# Patient Record
Sex: Male | Born: 1960 | Race: White | Hispanic: No | Marital: Married | Smoking: Current every day smoker
Health system: Southern US, Community
[De-identification: ages and names within clinical notes are randomized; demographics above are authoritative.]

## PROBLEM LIST (undated history)

## (undated) DIAGNOSIS — F4312 Post-traumatic stress disorder, chronic: Secondary | ICD-10-CM

## (undated) DIAGNOSIS — C259 Malignant neoplasm of pancreas, unspecified: Secondary | ICD-10-CM

## (undated) DIAGNOSIS — S069XAA Unspecified intracranial injury with loss of consciousness status unknown, initial encounter: Secondary | ICD-10-CM

## (undated) DIAGNOSIS — R569 Unspecified convulsions: Secondary | ICD-10-CM

## (undated) DIAGNOSIS — G8929 Other chronic pain: Secondary | ICD-10-CM

## (undated) DIAGNOSIS — S069X9A Unspecified intracranial injury with loss of consciousness of unspecified duration, initial encounter: Secondary | ICD-10-CM

## (undated) DIAGNOSIS — S42009A Fracture of unspecified part of unspecified clavicle, initial encounter for closed fracture: Secondary | ICD-10-CM

## (undated) HISTORY — PX: BACK SURGERY: SHX140

---

## 2017-10-02 DIAGNOSIS — R69 Illness, unspecified: Secondary | ICD-10-CM | POA: Diagnosis not present

## 2017-10-30 DIAGNOSIS — R69 Illness, unspecified: Secondary | ICD-10-CM | POA: Diagnosis not present

## 2017-11-27 DIAGNOSIS — R69 Illness, unspecified: Secondary | ICD-10-CM | POA: Diagnosis not present

## 2017-12-17 DIAGNOSIS — F4310 Post-traumatic stress disorder, unspecified: Secondary | ICD-10-CM | POA: Diagnosis not present

## 2017-12-17 DIAGNOSIS — Z9114 Patient's other noncompliance with medication regimen: Secondary | ICD-10-CM | POA: Diagnosis not present

## 2017-12-17 DIAGNOSIS — F17210 Nicotine dependence, cigarettes, uncomplicated: Secondary | ICD-10-CM | POA: Diagnosis not present

## 2017-12-17 DIAGNOSIS — F333 Major depressive disorder, recurrent, severe with psychotic symptoms: Secondary | ICD-10-CM | POA: Diagnosis not present

## 2017-12-17 DIAGNOSIS — R45851 Suicidal ideations: Secondary | ICD-10-CM | POA: Diagnosis not present

## 2017-12-17 DIAGNOSIS — Z8507 Personal history of malignant neoplasm of pancreas: Secondary | ICD-10-CM | POA: Diagnosis not present

## 2017-12-17 DIAGNOSIS — R45850 Homicidal ideations: Secondary | ICD-10-CM | POA: Diagnosis not present

## 2017-12-17 DIAGNOSIS — F329 Major depressive disorder, single episode, unspecified: Secondary | ICD-10-CM | POA: Diagnosis not present

## 2017-12-17 DIAGNOSIS — Z915 Personal history of self-harm: Secondary | ICD-10-CM | POA: Diagnosis not present

## 2017-12-17 DIAGNOSIS — Z720 Tobacco use: Secondary | ICD-10-CM | POA: Diagnosis not present

## 2017-12-17 DIAGNOSIS — E039 Hypothyroidism, unspecified: Secondary | ICD-10-CM | POA: Diagnosis not present

## 2017-12-17 DIAGNOSIS — F419 Anxiety disorder, unspecified: Secondary | ICD-10-CM | POA: Diagnosis not present

## 2017-12-17 DIAGNOSIS — F1410 Cocaine abuse, uncomplicated: Secondary | ICD-10-CM | POA: Diagnosis not present

## 2017-12-17 DIAGNOSIS — Z87820 Personal history of traumatic brain injury: Secondary | ICD-10-CM | POA: Diagnosis not present

## 2017-12-17 DIAGNOSIS — R569 Unspecified convulsions: Secondary | ICD-10-CM | POA: Diagnosis not present

## 2017-12-17 DIAGNOSIS — Z886 Allergy status to analgesic agent status: Secondary | ICD-10-CM | POA: Diagnosis not present

## 2017-12-17 DIAGNOSIS — G40909 Epilepsy, unspecified, not intractable, without status epilepticus: Secondary | ICD-10-CM | POA: Diagnosis not present

## 2017-12-17 DIAGNOSIS — R69 Illness, unspecified: Secondary | ICD-10-CM | POA: Diagnosis not present

## 2017-12-18 DIAGNOSIS — R45851 Suicidal ideations: Secondary | ICD-10-CM | POA: Diagnosis not present

## 2017-12-18 DIAGNOSIS — Z720 Tobacco use: Secondary | ICD-10-CM | POA: Diagnosis not present

## 2017-12-18 DIAGNOSIS — G40909 Epilepsy, unspecified, not intractable, without status epilepticus: Secondary | ICD-10-CM | POA: Diagnosis not present

## 2017-12-18 DIAGNOSIS — E039 Hypothyroidism, unspecified: Secondary | ICD-10-CM | POA: Diagnosis not present

## 2017-12-19 DIAGNOSIS — R45851 Suicidal ideations: Secondary | ICD-10-CM | POA: Diagnosis not present

## 2017-12-19 DIAGNOSIS — G40909 Epilepsy, unspecified, not intractable, without status epilepticus: Secondary | ICD-10-CM | POA: Diagnosis not present

## 2017-12-19 DIAGNOSIS — E039 Hypothyroidism, unspecified: Secondary | ICD-10-CM | POA: Diagnosis not present

## 2017-12-19 DIAGNOSIS — Z720 Tobacco use: Secondary | ICD-10-CM | POA: Diagnosis not present

## 2017-12-20 DIAGNOSIS — R69 Illness, unspecified: Secondary | ICD-10-CM | POA: Diagnosis not present

## 2017-12-20 DIAGNOSIS — F4310 Post-traumatic stress disorder, unspecified: Secondary | ICD-10-CM | POA: Diagnosis not present

## 2017-12-21 DIAGNOSIS — R69 Illness, unspecified: Secondary | ICD-10-CM | POA: Diagnosis not present

## 2017-12-21 DIAGNOSIS — F4310 Post-traumatic stress disorder, unspecified: Secondary | ICD-10-CM | POA: Diagnosis not present

## 2017-12-22 DIAGNOSIS — F333 Major depressive disorder, recurrent, severe with psychotic symptoms: Secondary | ICD-10-CM | POA: Diagnosis not present

## 2017-12-22 DIAGNOSIS — R69 Illness, unspecified: Secondary | ICD-10-CM | POA: Diagnosis not present

## 2017-12-23 DIAGNOSIS — R69 Illness, unspecified: Secondary | ICD-10-CM | POA: Diagnosis not present

## 2017-12-23 DIAGNOSIS — F333 Major depressive disorder, recurrent, severe with psychotic symptoms: Secondary | ICD-10-CM | POA: Diagnosis not present

## 2017-12-24 DIAGNOSIS — F4310 Post-traumatic stress disorder, unspecified: Secondary | ICD-10-CM | POA: Diagnosis not present

## 2017-12-24 DIAGNOSIS — Z720 Tobacco use: Secondary | ICD-10-CM | POA: Diagnosis not present

## 2017-12-24 DIAGNOSIS — E039 Hypothyroidism, unspecified: Secondary | ICD-10-CM | POA: Diagnosis not present

## 2017-12-24 DIAGNOSIS — R69 Illness, unspecified: Secondary | ICD-10-CM | POA: Diagnosis not present

## 2017-12-25 DIAGNOSIS — E039 Hypothyroidism, unspecified: Secondary | ICD-10-CM | POA: Diagnosis not present

## 2017-12-25 DIAGNOSIS — F4310 Post-traumatic stress disorder, unspecified: Secondary | ICD-10-CM | POA: Diagnosis not present

## 2017-12-25 DIAGNOSIS — Z720 Tobacco use: Secondary | ICD-10-CM | POA: Diagnosis not present

## 2017-12-25 DIAGNOSIS — R69 Illness, unspecified: Secondary | ICD-10-CM | POA: Diagnosis not present

## 2017-12-30 DIAGNOSIS — R69 Illness, unspecified: Secondary | ICD-10-CM | POA: Diagnosis not present

## 2018-01-13 DIAGNOSIS — R69 Illness, unspecified: Secondary | ICD-10-CM | POA: Diagnosis not present

## 2018-03-03 DIAGNOSIS — R69 Illness, unspecified: Secondary | ICD-10-CM | POA: Diagnosis not present

## 2018-03-27 DIAGNOSIS — K862 Cyst of pancreas: Secondary | ICD-10-CM | POA: Diagnosis not present

## 2018-04-29 DIAGNOSIS — R69 Illness, unspecified: Secondary | ICD-10-CM | POA: Diagnosis not present

## 2018-06-29 ENCOUNTER — Other Ambulatory Visit: Payer: Self-pay

## 2018-06-29 ENCOUNTER — Ambulatory Visit (INDEPENDENT_AMBULATORY_CARE_PROVIDER_SITE_OTHER): Payer: Medicare HMO

## 2018-06-29 ENCOUNTER — Ambulatory Visit
Admission: EM | Admit: 2018-06-29 | Discharge: 2018-06-29 | Disposition: A | Discharge status: Home / Self Care | Payer: Medicare HMO | Attending: Family Medicine | Admitting: Family Medicine

## 2018-06-29 ENCOUNTER — Encounter: Payer: Self-pay | Admitting: Emergency Medicine

## 2018-06-29 DIAGNOSIS — C259 Malignant neoplasm of pancreas, unspecified: Secondary | ICD-10-CM | POA: Insufficient documentation

## 2018-06-29 DIAGNOSIS — Z79899 Other long term (current) drug therapy: Secondary | ICD-10-CM | POA: Diagnosis not present

## 2018-06-29 DIAGNOSIS — Z888 Allergy status to other drugs, medicaments and biological substances status: Secondary | ICD-10-CM | POA: Diagnosis not present

## 2018-06-29 DIAGNOSIS — S42002A Fracture of unspecified part of left clavicle, initial encounter for closed fracture: Secondary | ICD-10-CM | POA: Diagnosis not present

## 2018-06-29 DIAGNOSIS — M25512 Pain in left shoulder: Secondary | ICD-10-CM | POA: Diagnosis not present

## 2018-06-29 DIAGNOSIS — F17200 Nicotine dependence, unspecified, uncomplicated: Secondary | ICD-10-CM | POA: Insufficient documentation

## 2018-06-29 DIAGNOSIS — F4312 Post-traumatic stress disorder, chronic: Secondary | ICD-10-CM | POA: Insufficient documentation

## 2018-06-29 DIAGNOSIS — R69 Illness, unspecified: Secondary | ICD-10-CM | POA: Diagnosis not present

## 2018-06-29 DIAGNOSIS — Z880 Allergy status to penicillin: Secondary | ICD-10-CM | POA: Insufficient documentation

## 2018-06-29 DIAGNOSIS — R0602 Shortness of breath: Secondary | ICD-10-CM

## 2018-06-29 DIAGNOSIS — M542 Cervicalgia: Secondary | ICD-10-CM | POA: Diagnosis not present

## 2018-06-29 DIAGNOSIS — R569 Unspecified convulsions: Secondary | ICD-10-CM | POA: Insufficient documentation

## 2018-06-29 DIAGNOSIS — M898X1 Other specified disorders of bone, shoulder: Secondary | ICD-10-CM | POA: Diagnosis not present

## 2018-06-29 DIAGNOSIS — Z87820 Personal history of traumatic brain injury: Secondary | ICD-10-CM | POA: Diagnosis not present

## 2018-06-29 DIAGNOSIS — G8929 Other chronic pain: Secondary | ICD-10-CM | POA: Insufficient documentation

## 2018-06-29 DIAGNOSIS — J449 Chronic obstructive pulmonary disease, unspecified: Secondary | ICD-10-CM | POA: Diagnosis not present

## 2018-06-29 DIAGNOSIS — S42022A Displaced fracture of shaft of left clavicle, initial encounter for closed fracture: Secondary | ICD-10-CM | POA: Diagnosis not present

## 2018-06-29 HISTORY — DX: Unspecified convulsions: R56.9

## 2018-06-29 HISTORY — DX: Unspecified intracranial injury with loss of consciousness status unknown, initial encounter: S06.9XAA

## 2018-06-29 HISTORY — DX: Fracture of unspecified part of unspecified clavicle, initial encounter for closed fracture: S42.009A

## 2018-06-29 HISTORY — DX: Post-traumatic stress disorder, chronic: F43.12

## 2018-06-29 HISTORY — DX: Unspecified intracranial injury with loss of consciousness of unspecified duration, initial encounter: S06.9X9A

## 2018-06-29 HISTORY — DX: Other chronic pain: G89.29

## 2018-06-29 HISTORY — DX: Malignant neoplasm of pancreas, unspecified: C25.9

## 2018-06-29 MED ORDER — ALBUTEROL SULFATE HFA 108 (90 BASE) MCG/ACT IN AERS: 2.0000 | INHALATION_SPRAY | RESPIRATORY_TRACT | 0 refills | Status: AC | PRN

## 2018-06-29 MED ORDER — PREDNISONE 10 MG PO TABS: ORAL_TABLET | ORAL | 0 refills | Status: AC

## 2018-06-29 NOTE — Discharge Instructions (Signed)
Take medication as prescribed. Rest. Drink plenty of fluids.   Follow-up with orthopedic this week as discussed.  See above to call today to schedule.  Follow up with your primary care physician soon as possible.  Return to Urgent care for new or worsening concerns.

## 2018-06-29 NOTE — ED Triage Notes (Signed)
Patient stated he broke his left side collar bone 2 years ago. Patient stated he has had constant pain for 2 years. Patient just moved here and stated PCP can't see him until December.

## 2018-06-29 NOTE — ED Provider Notes (Addendum)
[facility] ____________________________________________  Time seen: Approximately 11:55 AM  I have reviewed the triage vital signs and the nursing notes.   HISTORY  Chief Complaint Shoulder Pain  HPI Brandon Mcintosh is a 56 y.o. male past medical history of PTSD, left clavicle fracture with chronic pain, TBI, seizures, pancreatic cyst, presenting for evaluation of acute on chronic left clavicular pain.  Patient states this pain is been present for the last 2 years after he sustained a fall while repairing his deck and fell directly on his left shoulder.  States pain flares up every so often, and reports the pain is been present for the last several weeks.  States pain is present with laying on his left side, left shoulder movement as well as direct palpation.  States no pain if sitting completely still and comfortable position.  States occasional pain into his left upper shoulder.  Denies any recent fall or repeat injury.  No skin changes.  No chest pain.  States this feels consistent with his previous injury.  Patient also reports he has chronic left neck pain that is been ongoing for years and intermittently radiates up and down his neck, denies any acute changes to this.  Patient also reports for approximately 1 year he has been having shortness of breath, and reports that his girlfriend intermittently tells him that he is wheezing, worse at night. Denies known trigger for shortness of breath.  States some intermittent shortness of breath today as well, none current.  No recent fevers, nasal congestion, atypical cough, hemoptysis, weight loss or activity changes.  Continues remain active.  States that he has been taking his regular medicines as well as ibuprofen 600 mg and baclofen for his left shoulder without resolution.  Reports allergic to Toradol.  Denies other aggravating or alleviating factors.  Currently awaiting new PCP appointment that is in December.  Follows with psychiatry at  [facility]. Denies chest pain, abdominal pain, dysuria, lower extremity pain, extremity swelling or rash. Denies recent sickness. Denies recent antibiotic use.   Patient was also recently seen in ER for depression and suicidal ideation,seen 05/21/2018 in [place].  Patient denies any suicidal or homicidal ideation at this time.   Past Medical History:  Diagnosis Date  . Chronic pain   . Chronic post-traumatic stress disorder (PTSD)   . Clavicle fracture   . Pancreatic cancer ([place])   . Seizure ([place])   . TBI (traumatic brain injury) ([place])     There are no active problems to display for this patient.   Past Surgical History:  Procedure Laterality Date  . BACK SURGERY       No current facility-administered medications for this encounter.   Current Outpatient Medications:  .  acetaminophen (TYLENOL) 500 MG tablet, Take 500 mg by mouth every 6 (six) hours as needed., Disp: , Rfl:  .  baclofen (LIORESAL) 10 MG tablet, Take 10 mg by mouth 3 (three) times daily., Disp: , Rfl:  .  gabapentin (NEURONTIN) 300 MG capsule, Take 300 mg by mouth 3 (three) times daily., Disp: , Rfl:  .  levETIRAcetam (KEPPRA) 750 MG tablet, Take 750 mg by mouth 2 (two) times daily., Disp: , Rfl:  .  QUEtiapine (SEROQUEL) 100 MG tablet, Take 100 mg by mouth daily with breakfast., Disp: , Rfl:  .  QUEtiapine (SEROQUEL) 400 MG tablet, Take 400 mg by mouth at bedtime., Disp: , Rfl:  .  sucralfate (CARAFATE) 1 g tablet, Take 1 g by mouth 4 (four) times daily.,  Disp: , Rfl:  .  traZODone (DESYREL) 100 MG tablet, Take 100 mg by mouth at bedtime., Disp: , Rfl:  .  albuterol (PROVENTIL HFA;VENTOLIN HFA) 108 (90 Base) MCG/ACT inhaler, Inhale 2 puffs into the lungs every 4 (four) hours as needed., Disp: 1 Inhaler, Rfl: 0 .  predniSONE (DELTASONE) 10 MG tablet, Start 60 mg po day one, then 50 mg po day two, taper by 10 mg daily until complete., Disp: 21 tablet, Rfl: 0  Allergies Penicillins; Propoxyphene; Toradol [ketorolac  tromethamine]; and Tramadol  History reviewed. No pertinent family history.  Social History Social History   Tobacco Use  . Smoking status: Current Every Day Smoker  . Smokeless tobacco: Never Used  Substance Use Topics  . Alcohol use: Yes  . Drug use: Yes    Types: Marijuana    Comment: 2 months ago    Review of Systems Constitutional: No fever Cardiovascular: Denies chest pain. Respiratory: As above.  Gastrointestinal: No abdominal pain.  No nausea, no vomiting.  No diarrhea.  No constipation. Genitourinary: Negative for dysuria. Musculoskeletal: Negative for back pain. Skin: Negative for rash. Neurological: Negative for focal weakness or numbness.   ____________________________________________   PHYSICAL EXAM:  VITAL SIGNS: ED Triage Vitals [06/29/18 1049]  Enc Vitals Group     BP 117/89     Pulse Rate 82     Resp 18     Temp 98.1 F (36.7 C)     Temp Source Oral     SpO2 97 %     Weight 170 lb (77.1 kg)     Height 5\' 11"  (1.803 m)     Head Circumference      Peak Flow      Pain Score 9     Pain Loc      Pain Edu?      Excl. in [place]?     Constitutional: Alert and oriented. Well appearing and in no acute distress. Eyes: Conjunctivae are normal.  ENT      Head: Normocephalic and atraumatic.      Nose: No congestion      Mouth/Throat: Mucous membranes are moist.Oropharynx non-erythematous. Neck: No stridor. Supple without meningismus.  Hematological/Lymphatic/Immunilogical: No cervical lymphadenopathy. Cardiovascular: Normal rate, regular rhythm. Grossly normal heart sounds.  Good peripheral circulation. Respiratory: Normal respiratory effort without tachypnea nor retractions. Breath sounds are clear and equal bilaterally. No wheezes, rales, rhonchi. Musculoskeletal:Steady gait. Left hand grip strong.  Left mid to distal clavicle deformity palpated with moderate tenderness to direct palpation, no ecchymosis, left anterior shoulder at [facility] joint mild to  moderate tenderness to direct palpation, pain with left shoulder abduction, and negative drop arm test, able to abduct greater than 90 degrees, left upper extremity otherwise nontender, normal sensation to left upper extremity per patient, chest nontender to direct palpation.  Neurologic:  Normal speech and language. No gross focal neurologic deficits are appreciated.  Skin:  Skin is warm, dry and intact. No rash noted. Psychiatric: Mood and affect are normal. Speech and behavior are normal. Patient exhibits appropriate insight and judgment   ___________________________________________   LABS (all labs ordered are listed, but only abnormal results are displayed)  Labs Reviewed - No data to display ____________________________________________  EKG  ED ECG REPORT I, Marylene Land, the attending provider, personally viewed and interpreted this ECG.   Date: 06/29/2018  EKG Time: 1202   Rate: 66  Rhythm:  normal sinus rhythm  Axis: normal   Intervals:none  ST&T Change: none noted.  No previous EKG available for comparison.  RADIOLOGY  Dg Chest 2 View  Result Date: 06/29/2018 CLINICAL DATA:  Pancreatic cancer. Left clavicle pain. EXAM: CHEST - 2 VIEW COMPARISON:  Left clavicle radiographs obtained at the same time. FINDINGS: Partially healed distal left clavicle fracture. Partially healed right posterior 6th and 7th rib fractures. Normal sized heart. Clear lungs. The lungs are mildly hyperexpanded with mildly prominent interstitial markings. Mild thoracic spine degenerative changes. IMPRESSION: 1. Partially healed distal left clavicle fracture. 2. Partially healed right 6th and 7th rib fractures. 3. Mild changes of COPD. Electronically Signed   By: Claudie Revering M.D.   On: 06/29/2018 12:47   Dg Clavicle Left  Result Date: 06/29/2018 CLINICAL DATA:  Left clavicle pain. Pancreatic cancer. EXAM: LEFT CLAVICLE - 2+ VIEWS COMPARISON:  Chest radiographs obtained at the same time FINDINGS:  Partially healed distal left clavicle fracture. No definite lytic component to indicate a pathological fracture. No acute fracture or dislocation seen. IMPRESSION: Partially healed distal left clavicle fracture. Electronically Signed   By: Claudie Revering M.D.   On: 06/29/2018 12:48   ____________________________________________   PROCEDURES Procedures    INITIAL IMPRESSION / ASSESSMENT AND PLAN / ED COURSE  Pertinent labs & imaging results that were available during my care of the patient were reviewed by me and considered in my medical decision making (see chart for details).  Well-appearing patient.  Patient with a complex past medical history presenting for evaluation of acute on chronic left clavicle pain.  Patient states pain that is present now is same as previous flareups.  No recent trauma or injury.  Suspect inflammation and discuss possible left shoulder.  Denies any chest pain.  EKG evaluated today.  No previous for comparison available.  Chest x-ray also evaluated due to shortness of breath intermittently for 1 year.  Noted mild COPD.  X-rays as above per radiologist and reviewed by myself.  Rx PRN albuterol inhaler as needed, patient reports then that he has previously had this and it did help.  Patient states he is unable to tolerate Toradol, concern of NSAID Rx.  Will treat with prednisone.  Directed to continue to follow-up with primary care as soon as possible as well as recommend orthopedic evaluation this week, local information given.  Discussed follow-up and return parameters. Discussed indication, risks and benefits of medications with patient.   Discussed follow up and return parameters including no resolution or any worsening concerns. Patient verbalized understanding and agreed to plan.   [place] controlled substance database reviewed, regular prescriptions of alprazolam, zaleplon, and detroamp-amphetamin without acute changes noted.    ____________________________________________   FINAL CLINICAL IMPRESSION(S) / ED DIAGNOSES  Final diagnoses:  Pain of left clavicle  SOB (shortness of breath)     ED Discharge Orders         Ordered    predniSONE (DELTASONE) 10 MG tablet     06/29/18 1310    albuterol (PROVENTIL HFA;VENTOLIN HFA) 108 (90 Base) MCG/ACT inhaler  Every 4 hours PRN     06/29/18 1310           Note: This dictation was prepared with Dragon dictation along with smaller phrase technology. Any transcriptional errors that result from this process are unintentional.         Marylene Land, NP 06/29/18 1341

## 2018-07-09 ENCOUNTER — Ambulatory Visit: Payer: Medicare HMO | Admitting: Family Medicine

## 2018-08-12 DIAGNOSIS — R69 Illness, unspecified: Secondary | ICD-10-CM | POA: Diagnosis not present

## 2018-11-03 DIAGNOSIS — R69 Illness, unspecified: Secondary | ICD-10-CM | POA: Diagnosis not present

## 2018-12-10 IMAGING — CR DG CHEST 2V
2 series · 2 of 2 positions shown · non-contrast
Comparison: Left clavicle radiographs obtained at the same time.

CLINICAL DATA: Pancreatic cancer. Left clavicle pain.

EXAM:
CHEST - 2 VIEW

[chest pa]
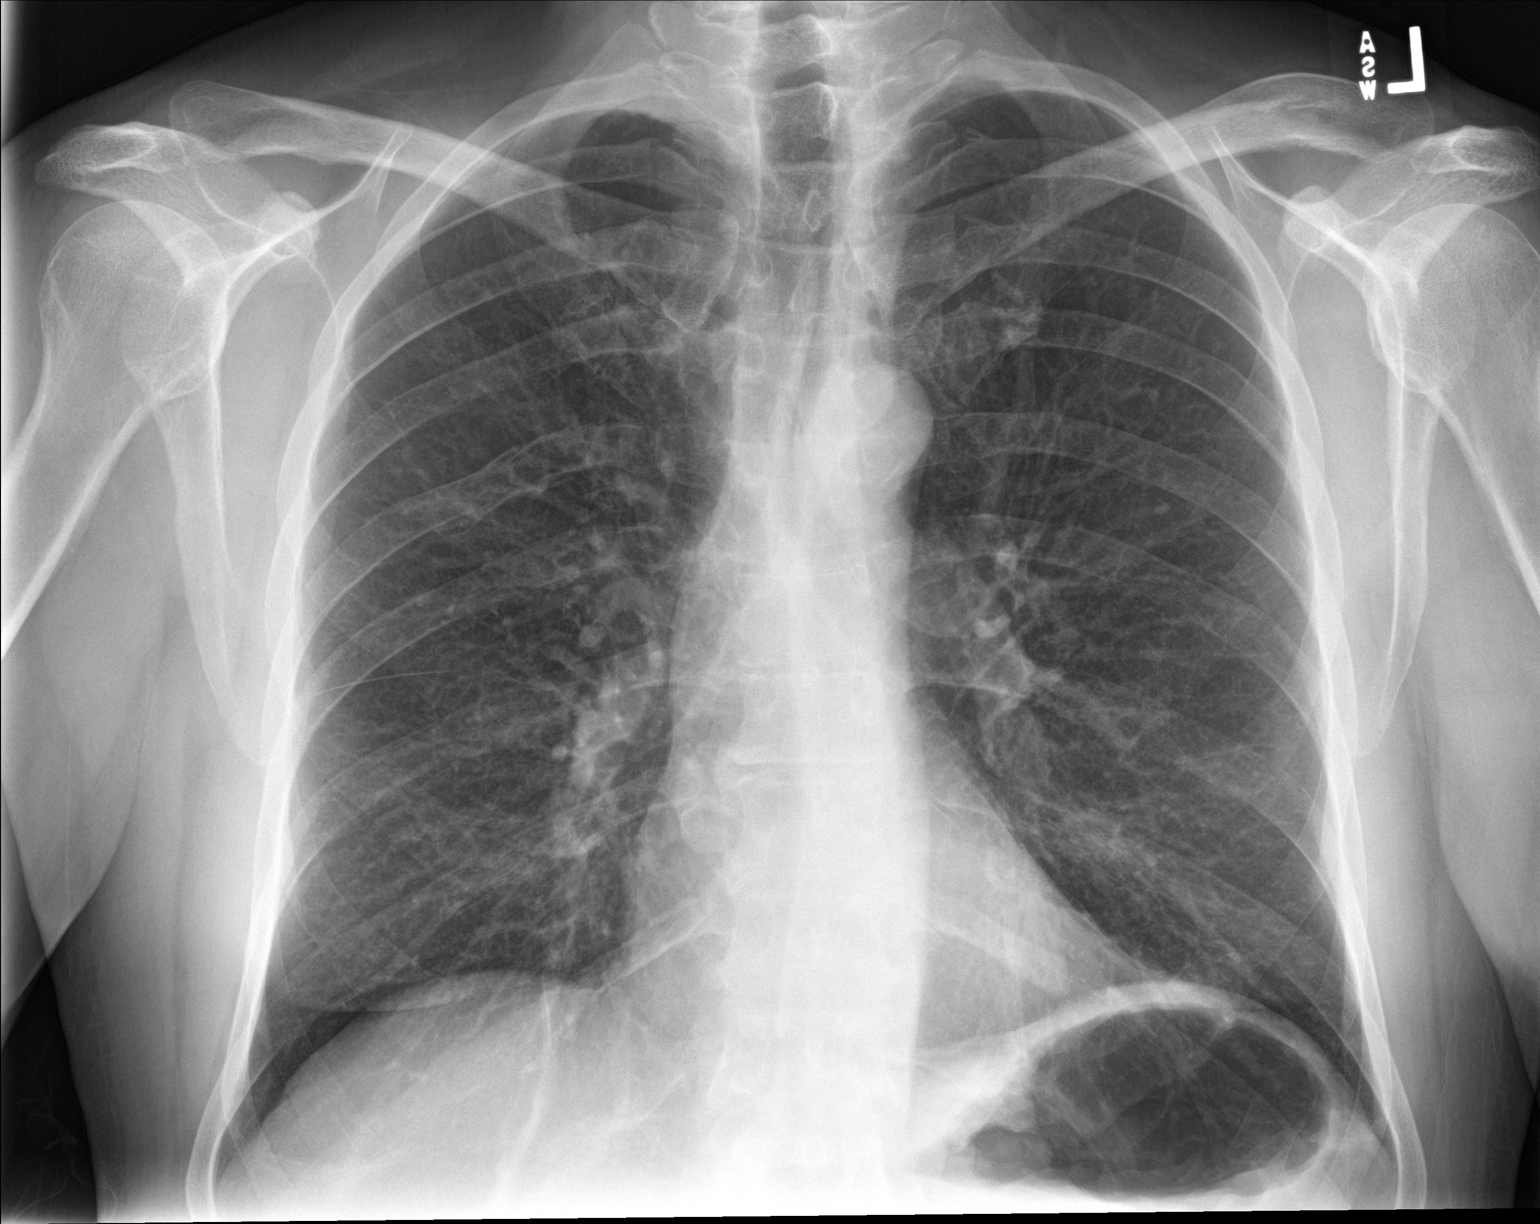

[chest lat]
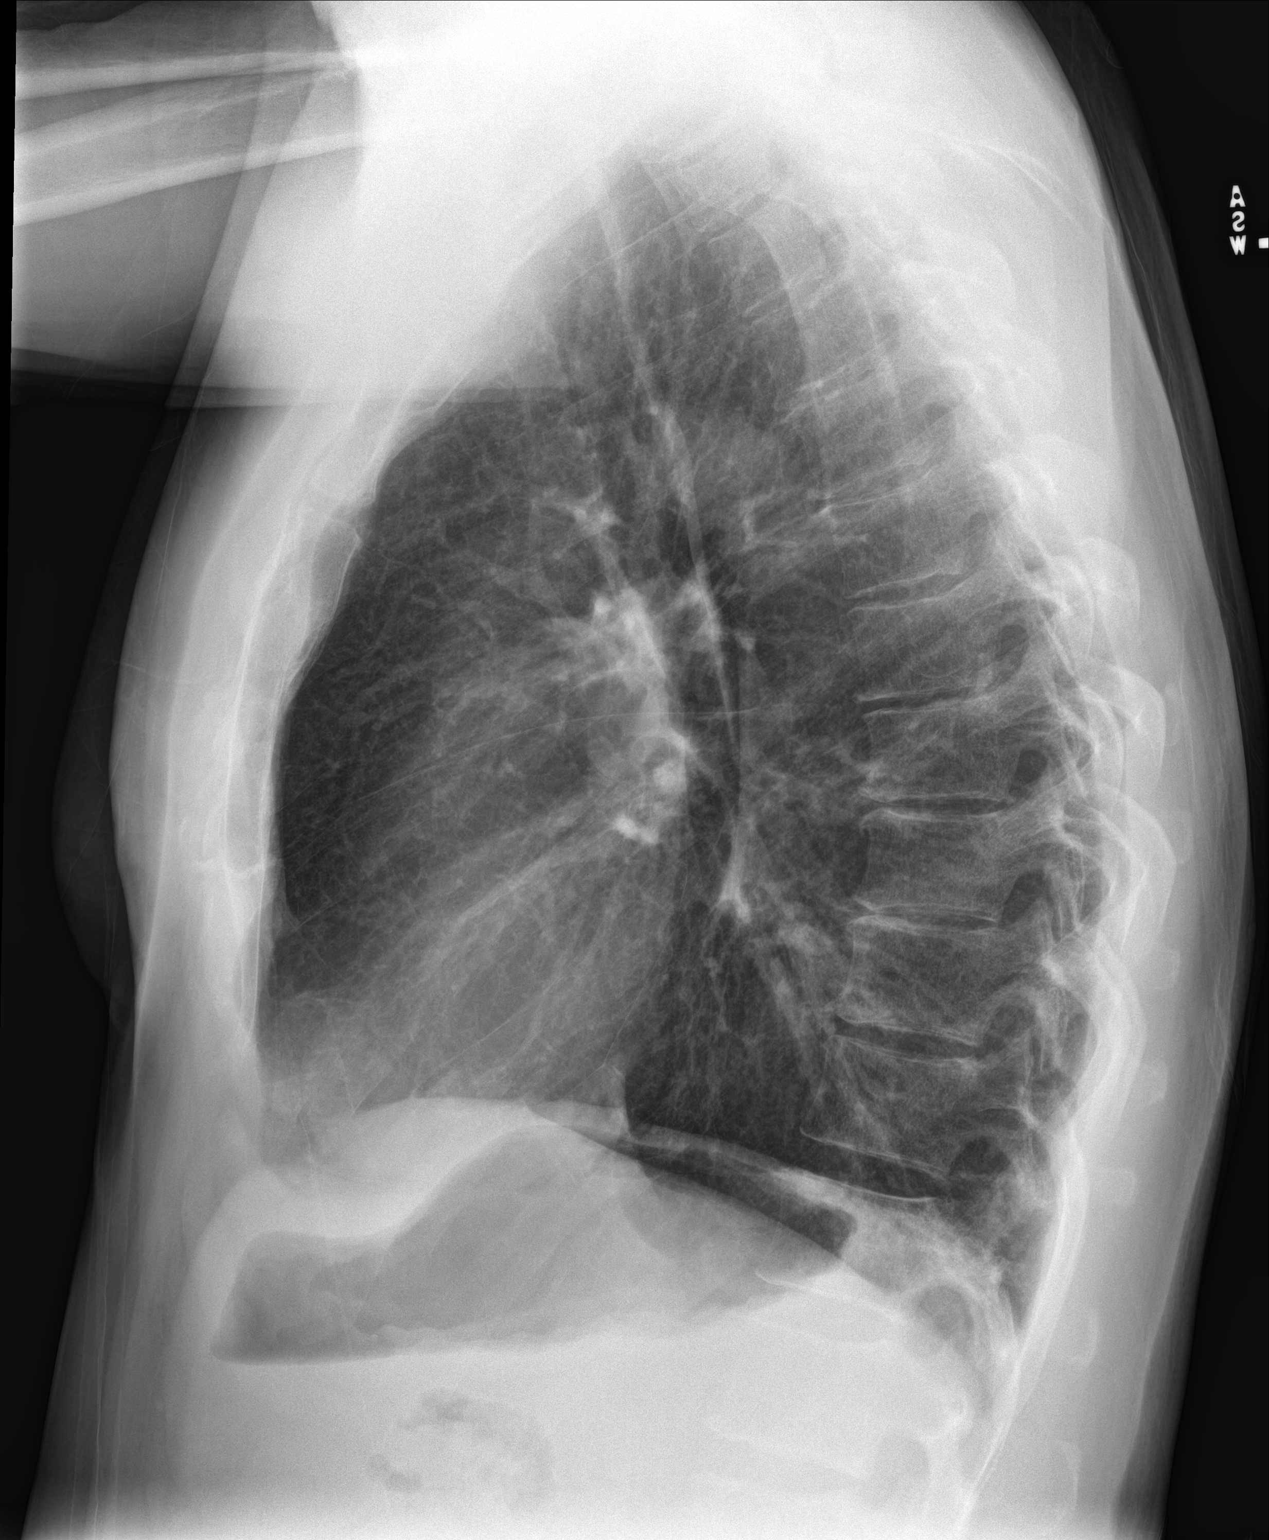

[2 of 2 positions shown; findings below may reference images not displayed]

FINDINGS: Partially healed distal left clavicle fracture. Partially healed
right posterior 6th and 7th rib fractures. Normal sized heart. Clear
lungs. The lungs are mildly hyperexpanded with mildly prominent
interstitial markings. Mild thoracic spine degenerative changes.
IMPRESSION: 1. Partially healed distal left clavicle fracture.
2. Partially healed right 6th and 7th rib fractures.
3. Mild changes of COPD.

## 2018-12-10 IMAGING — CR DG CLAVICLE*L*
2 series · 2 of 2 positions shown · non-contrast
Comparison: Chest radiographs obtained at the same time

CLINICAL DATA: Left clavicle pain. Pancreatic cancer.

EXAM:
LEFT CLAVICLE - 2+ VIEWS

[clavicle ap]
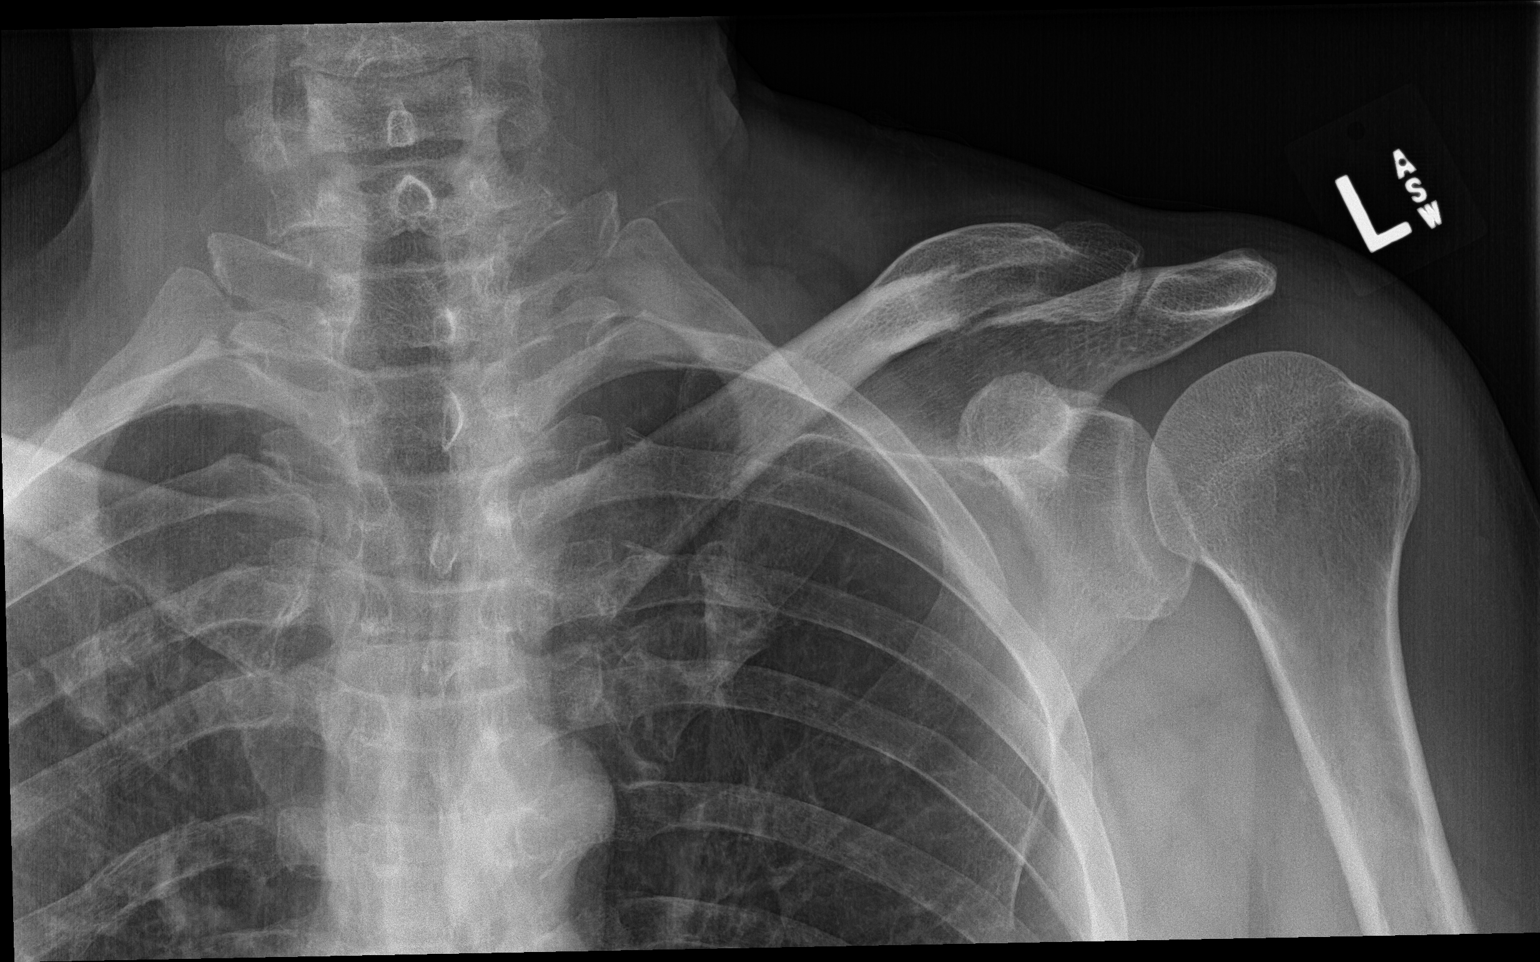

[clavicle axial]
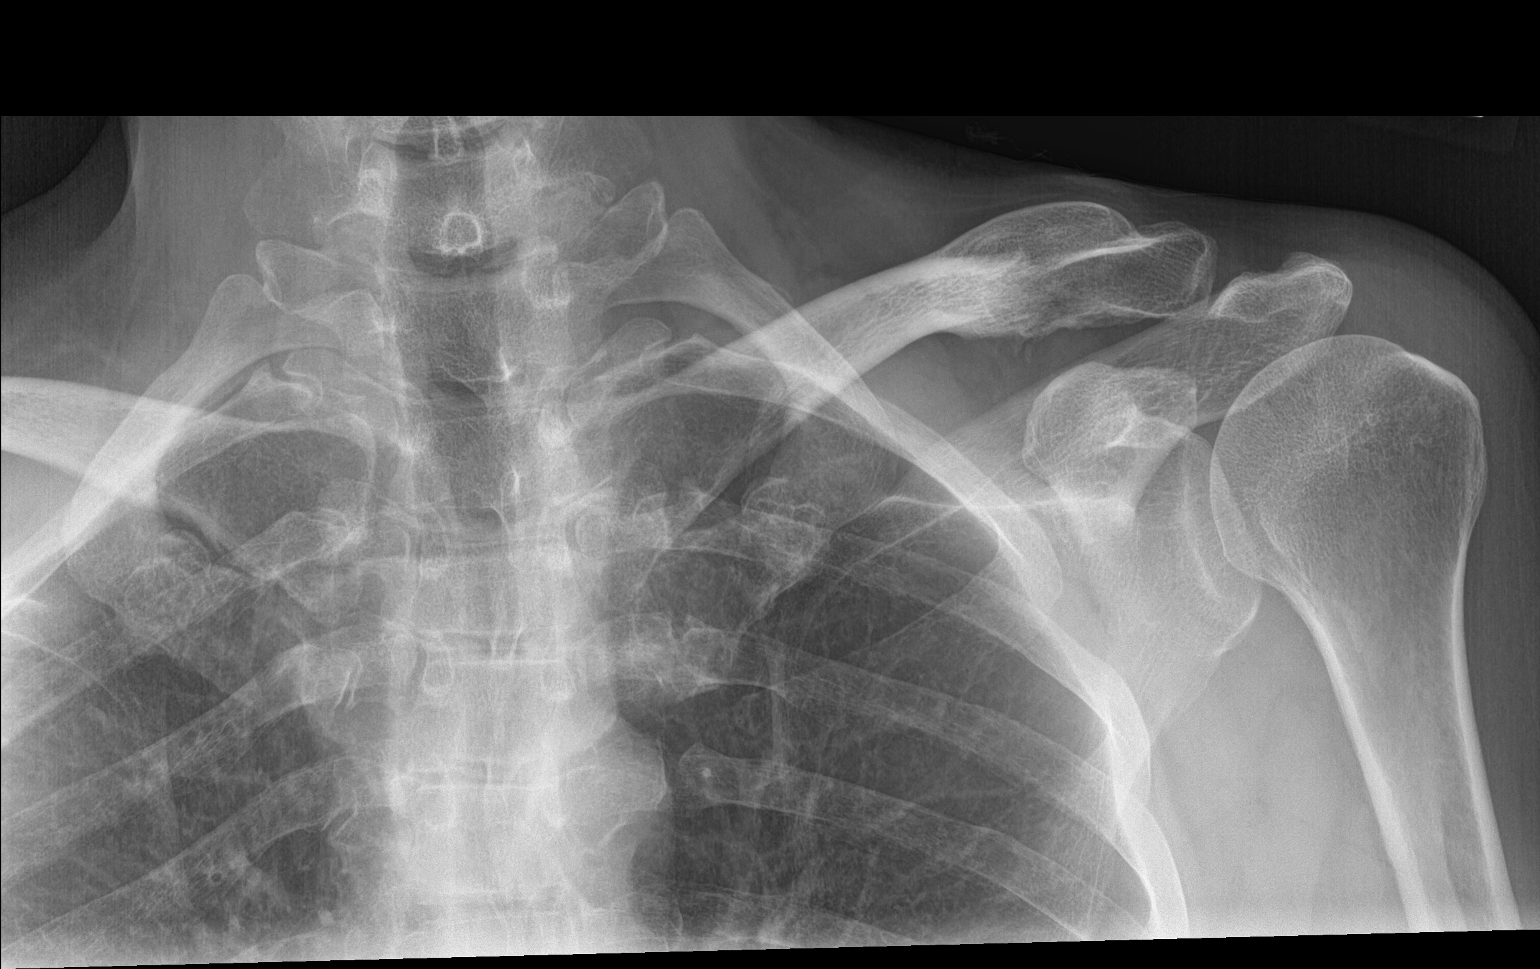

[2 of 2 positions shown; findings below may reference images not displayed]

FINDINGS: Partially healed distal left clavicle fracture. No definite lytic
component to indicate a pathological fracture. No acute fracture or
dislocation seen.
IMPRESSION: Partially healed distal left clavicle fracture.

## 2019-01-11 DIAGNOSIS — Z5329 Procedure and treatment not carried out because of patient's decision for other reasons: Secondary | ICD-10-CM | POA: Diagnosis not present

## 2019-01-11 DIAGNOSIS — R531 Weakness: Secondary | ICD-10-CM | POA: Diagnosis not present

## 2019-01-11 DIAGNOSIS — R202 Paresthesia of skin: Secondary | ICD-10-CM | POA: Diagnosis not present

## 2019-01-11 DIAGNOSIS — Z87820 Personal history of traumatic brain injury: Secondary | ICD-10-CM | POA: Diagnosis not present

## 2019-01-11 DIAGNOSIS — R5383 Other fatigue: Secondary | ICD-10-CM | POA: Diagnosis not present

## 2019-01-11 DIAGNOSIS — W19XXXS Unspecified fall, sequela: Secondary | ICD-10-CM | POA: Diagnosis not present

## 2019-01-11 DIAGNOSIS — R0602 Shortness of breath: Secondary | ICD-10-CM | POA: Diagnosis not present

## 2019-01-11 DIAGNOSIS — S42002S Fracture of unspecified part of left clavicle, sequela: Secondary | ICD-10-CM | POA: Diagnosis not present

## 2019-01-20 DIAGNOSIS — R0902 Hypoxemia: Secondary | ICD-10-CM | POA: Diagnosis not present

## 2019-01-20 DIAGNOSIS — J9811 Atelectasis: Secondary | ICD-10-CM | POA: Diagnosis not present

## 2019-01-20 DIAGNOSIS — R531 Weakness: Secondary | ICD-10-CM | POA: Diagnosis not present

## 2019-01-20 DIAGNOSIS — R296 Repeated falls: Secondary | ICD-10-CM | POA: Diagnosis not present

## 2019-01-20 DIAGNOSIS — R202 Paresthesia of skin: Secondary | ICD-10-CM | POA: Diagnosis not present

## 2019-01-20 DIAGNOSIS — R404 Transient alteration of awareness: Secondary | ICD-10-CM | POA: Diagnosis not present

## 2019-01-20 DIAGNOSIS — K869 Disease of pancreas, unspecified: Secondary | ICD-10-CM | POA: Diagnosis not present

## 2019-01-20 DIAGNOSIS — N329 Bladder disorder, unspecified: Secondary | ICD-10-CM | POA: Diagnosis not present

## 2019-01-20 DIAGNOSIS — R262 Difficulty in walking, not elsewhere classified: Secondary | ICD-10-CM | POA: Diagnosis not present

## 2019-01-20 DIAGNOSIS — W1839XA Other fall on same level, initial encounter: Secondary | ICD-10-CM | POA: Diagnosis not present

## 2019-01-20 DIAGNOSIS — N3289 Other specified disorders of bladder: Secondary | ICD-10-CM | POA: Diagnosis not present

## 2019-01-20 DIAGNOSIS — R52 Pain, unspecified: Secondary | ICD-10-CM | POA: Diagnosis not present

## 2019-01-20 DIAGNOSIS — W19XXXA Unspecified fall, initial encounter: Secondary | ICD-10-CM | POA: Diagnosis not present

## 2019-01-20 DIAGNOSIS — M542 Cervicalgia: Secondary | ICD-10-CM | POA: Diagnosis not present

## 2019-01-20 DIAGNOSIS — K862 Cyst of pancreas: Secondary | ICD-10-CM | POA: Diagnosis not present

## 2019-01-20 DIAGNOSIS — R269 Unspecified abnormalities of gait and mobility: Secondary | ICD-10-CM | POA: Diagnosis not present

## 2019-01-20 DIAGNOSIS — R569 Unspecified convulsions: Secondary | ICD-10-CM | POA: Diagnosis not present

## 2019-01-20 DIAGNOSIS — K8689 Other specified diseases of pancreas: Secondary | ICD-10-CM | POA: Diagnosis not present

## 2019-01-20 DIAGNOSIS — R69 Illness, unspecified: Secondary | ICD-10-CM | POA: Diagnosis not present

## 2019-01-20 DIAGNOSIS — R51 Headache: Secondary | ICD-10-CM | POA: Diagnosis not present

## 2019-01-20 DIAGNOSIS — M545 Low back pain: Secondary | ICD-10-CM | POA: Diagnosis not present

## 2019-01-21 DIAGNOSIS — N329 Bladder disorder, unspecified: Secondary | ICD-10-CM | POA: Diagnosis not present

## 2019-01-21 DIAGNOSIS — N319 Neuromuscular dysfunction of bladder, unspecified: Secondary | ICD-10-CM | POA: Diagnosis not present

## 2019-01-21 DIAGNOSIS — R531 Weakness: Secondary | ICD-10-CM | POA: Diagnosis not present

## 2019-01-22 DIAGNOSIS — R531 Weakness: Secondary | ICD-10-CM | POA: Diagnosis not present

## 2019-01-23 DIAGNOSIS — M4802 Spinal stenosis, cervical region: Secondary | ICD-10-CM | POA: Diagnosis not present

## 2019-01-23 DIAGNOSIS — R531 Weakness: Secondary | ICD-10-CM | POA: Diagnosis not present

## 2019-01-23 DIAGNOSIS — M4312 Spondylolisthesis, cervical region: Secondary | ICD-10-CM | POA: Diagnosis not present

## 2019-01-23 DIAGNOSIS — M47812 Spondylosis without myelopathy or radiculopathy, cervical region: Secondary | ICD-10-CM | POA: Diagnosis not present

## 2019-01-23 DIAGNOSIS — M2578 Osteophyte, vertebrae: Secondary | ICD-10-CM | POA: Diagnosis not present

## 2019-01-24 DIAGNOSIS — R531 Weakness: Secondary | ICD-10-CM | POA: Diagnosis not present

## 2019-01-25 DIAGNOSIS — R531 Weakness: Secondary | ICD-10-CM | POA: Diagnosis not present

## 2019-01-26 DIAGNOSIS — G992 Myelopathy in diseases classified elsewhere: Secondary | ICD-10-CM | POA: Diagnosis not present

## 2019-01-26 DIAGNOSIS — M4802 Spinal stenosis, cervical region: Secondary | ICD-10-CM | POA: Diagnosis not present

## 2019-01-26 DIAGNOSIS — R531 Weakness: Secondary | ICD-10-CM | POA: Diagnosis not present

## 2019-01-27 DIAGNOSIS — T17320D Food in larynx causing asphyxiation, subsequent encounter: Secondary | ICD-10-CM | POA: Diagnosis not present

## 2019-01-27 DIAGNOSIS — R1310 Dysphagia, unspecified: Secondary | ICD-10-CM | POA: Diagnosis not present

## 2019-01-27 DIAGNOSIS — T85528A Displacement of other gastrointestinal prosthetic devices, implants and grafts, initial encounter: Secondary | ICD-10-CM | POA: Diagnosis not present

## 2019-01-27 DIAGNOSIS — R296 Repeated falls: Secondary | ICD-10-CM | POA: Diagnosis not present

## 2019-01-27 DIAGNOSIS — R1013 Epigastric pain: Secondary | ICD-10-CM | POA: Diagnosis not present

## 2019-01-27 DIAGNOSIS — J690 Pneumonitis due to inhalation of food and vomit: Secondary | ICD-10-CM | POA: Diagnosis not present

## 2019-01-27 DIAGNOSIS — R531 Weakness: Secondary | ICD-10-CM | POA: Diagnosis not present

## 2019-01-27 DIAGNOSIS — G992 Myelopathy in diseases classified elsewhere: Secondary | ICD-10-CM | POA: Diagnosis not present

## 2019-01-27 DIAGNOSIS — N329 Bladder disorder, unspecified: Secondary | ICD-10-CM | POA: Diagnosis not present

## 2019-01-27 DIAGNOSIS — T17320A Food in larynx causing asphyxiation, initial encounter: Secondary | ICD-10-CM | POA: Diagnosis not present

## 2019-01-27 DIAGNOSIS — R918 Other nonspecific abnormal finding of lung field: Secondary | ICD-10-CM | POA: Diagnosis not present

## 2019-01-27 DIAGNOSIS — M1990 Unspecified osteoarthritis, unspecified site: Secondary | ICD-10-CM | POA: Diagnosis not present

## 2019-01-27 DIAGNOSIS — M4712 Other spondylosis with myelopathy, cervical region: Secondary | ICD-10-CM | POA: Diagnosis not present

## 2019-01-27 DIAGNOSIS — M96840 Postprocedural hematoma of a musculoskeletal structure following a musculoskeletal system procedure: Secondary | ICD-10-CM | POA: Diagnosis not present

## 2019-01-27 DIAGNOSIS — Z4682 Encounter for fitting and adjustment of non-vascular catheter: Secondary | ICD-10-CM | POA: Diagnosis not present

## 2019-01-27 DIAGNOSIS — E039 Hypothyroidism, unspecified: Secondary | ICD-10-CM | POA: Diagnosis not present

## 2019-01-27 DIAGNOSIS — Z981 Arthrodesis status: Secondary | ICD-10-CM | POA: Diagnosis not present

## 2019-01-27 DIAGNOSIS — F4310 Post-traumatic stress disorder, unspecified: Secondary | ICD-10-CM | POA: Diagnosis not present

## 2019-01-27 DIAGNOSIS — G959 Disease of spinal cord, unspecified: Secondary | ICD-10-CM | POA: Diagnosis not present

## 2019-01-27 DIAGNOSIS — R262 Difficulty in walking, not elsewhere classified: Secondary | ICD-10-CM | POA: Diagnosis not present

## 2019-01-27 DIAGNOSIS — F332 Major depressive disorder, recurrent severe without psychotic features: Secondary | ICD-10-CM | POA: Diagnosis not present

## 2019-01-27 DIAGNOSIS — R0602 Shortness of breath: Secondary | ICD-10-CM | POA: Diagnosis not present

## 2019-01-27 DIAGNOSIS — R633 Feeding difficulties: Secondary | ICD-10-CM | POA: Diagnosis not present

## 2019-01-27 DIAGNOSIS — M4802 Spinal stenosis, cervical region: Secondary | ICD-10-CM | POA: Diagnosis not present

## 2019-01-27 DIAGNOSIS — R05 Cough: Secondary | ICD-10-CM | POA: Diagnosis not present

## 2019-01-27 DIAGNOSIS — F4321 Adjustment disorder with depressed mood: Secondary | ICD-10-CM | POA: Diagnosis not present

## 2019-01-27 DIAGNOSIS — Z1159 Encounter for screening for other viral diseases: Secondary | ICD-10-CM | POA: Diagnosis not present

## 2019-01-27 DIAGNOSIS — M5001 Cervical disc disorder with myelopathy,  high cervical region: Secondary | ICD-10-CM | POA: Diagnosis not present

## 2019-01-27 DIAGNOSIS — Y838 Other surgical procedures as the cause of abnormal reaction of the patient, or of later complication, without mention of misadventure at the time of the procedure: Secondary | ICD-10-CM | POA: Diagnosis not present

## 2019-01-27 DIAGNOSIS — R69 Illness, unspecified: Secondary | ICD-10-CM | POA: Diagnosis not present

## 2019-01-27 DIAGNOSIS — K869 Disease of pancreas, unspecified: Secondary | ICD-10-CM | POA: Diagnosis not present

## 2019-01-28 DIAGNOSIS — R531 Weakness: Secondary | ICD-10-CM | POA: Diagnosis not present

## 2019-01-29 DIAGNOSIS — R05 Cough: Secondary | ICD-10-CM | POA: Diagnosis not present

## 2019-01-29 DIAGNOSIS — R918 Other nonspecific abnormal finding of lung field: Secondary | ICD-10-CM | POA: Diagnosis not present

## 2019-01-29 DIAGNOSIS — R531 Weakness: Secondary | ICD-10-CM | POA: Diagnosis not present

## 2019-01-30 DIAGNOSIS — R531 Weakness: Secondary | ICD-10-CM | POA: Diagnosis not present

## 2019-01-31 DIAGNOSIS — R531 Weakness: Secondary | ICD-10-CM | POA: Diagnosis not present

## 2019-01-31 DIAGNOSIS — R0602 Shortness of breath: Secondary | ICD-10-CM | POA: Diagnosis not present

## 2019-02-01 DIAGNOSIS — R531 Weakness: Secondary | ICD-10-CM | POA: Diagnosis not present

## 2019-02-02 DIAGNOSIS — R531 Weakness: Secondary | ICD-10-CM | POA: Diagnosis not present

## 2019-02-03 DIAGNOSIS — R69 Illness, unspecified: Secondary | ICD-10-CM | POA: Diagnosis not present

## 2019-02-03 DIAGNOSIS — F4310 Post-traumatic stress disorder, unspecified: Secondary | ICD-10-CM | POA: Diagnosis not present

## 2019-02-03 DIAGNOSIS — F4321 Adjustment disorder with depressed mood: Secondary | ICD-10-CM | POA: Diagnosis not present

## 2019-02-03 DIAGNOSIS — F332 Major depressive disorder, recurrent severe without psychotic features: Secondary | ICD-10-CM | POA: Diagnosis not present

## 2019-02-03 DIAGNOSIS — R531 Weakness: Secondary | ICD-10-CM | POA: Diagnosis not present

## 2019-02-04 DIAGNOSIS — R531 Weakness: Secondary | ICD-10-CM | POA: Diagnosis not present

## 2019-02-04 DIAGNOSIS — F332 Major depressive disorder, recurrent severe without psychotic features: Secondary | ICD-10-CM | POA: Diagnosis not present

## 2019-02-04 DIAGNOSIS — F4310 Post-traumatic stress disorder, unspecified: Secondary | ICD-10-CM | POA: Diagnosis not present

## 2019-02-04 DIAGNOSIS — R69 Illness, unspecified: Secondary | ICD-10-CM | POA: Diagnosis not present

## 2019-02-04 DIAGNOSIS — F4321 Adjustment disorder with depressed mood: Secondary | ICD-10-CM | POA: Diagnosis not present

## 2019-02-05 DIAGNOSIS — F4321 Adjustment disorder with depressed mood: Secondary | ICD-10-CM | POA: Diagnosis not present

## 2019-02-05 DIAGNOSIS — F332 Major depressive disorder, recurrent severe without psychotic features: Secondary | ICD-10-CM | POA: Diagnosis not present

## 2019-02-05 DIAGNOSIS — R531 Weakness: Secondary | ICD-10-CM | POA: Diagnosis not present

## 2019-02-05 DIAGNOSIS — Z4682 Encounter for fitting and adjustment of non-vascular catheter: Secondary | ICD-10-CM | POA: Diagnosis not present

## 2019-02-05 DIAGNOSIS — F4310 Post-traumatic stress disorder, unspecified: Secondary | ICD-10-CM | POA: Diagnosis not present

## 2019-02-05 DIAGNOSIS — T17320D Food in larynx causing asphyxiation, subsequent encounter: Secondary | ICD-10-CM | POA: Diagnosis not present

## 2019-02-05 DIAGNOSIS — R1013 Epigastric pain: Secondary | ICD-10-CM | POA: Diagnosis not present

## 2019-02-05 DIAGNOSIS — R69 Illness, unspecified: Secondary | ICD-10-CM | POA: Diagnosis not present

## 2019-02-06 DIAGNOSIS — F332 Major depressive disorder, recurrent severe without psychotic features: Secondary | ICD-10-CM | POA: Diagnosis not present

## 2019-02-06 DIAGNOSIS — R531 Weakness: Secondary | ICD-10-CM | POA: Diagnosis not present

## 2019-02-06 DIAGNOSIS — R69 Illness, unspecified: Secondary | ICD-10-CM | POA: Diagnosis not present

## 2019-02-07 DIAGNOSIS — R05 Cough: Secondary | ICD-10-CM | POA: Diagnosis not present

## 2019-02-07 DIAGNOSIS — R531 Weakness: Secondary | ICD-10-CM | POA: Diagnosis not present

## 2019-02-08 DIAGNOSIS — Z4682 Encounter for fitting and adjustment of non-vascular catheter: Secondary | ICD-10-CM | POA: Diagnosis not present

## 2019-02-08 DIAGNOSIS — K869 Disease of pancreas, unspecified: Secondary | ICD-10-CM | POA: Diagnosis not present

## 2019-02-08 DIAGNOSIS — F4321 Adjustment disorder with depressed mood: Secondary | ICD-10-CM | POA: Diagnosis not present

## 2019-02-08 DIAGNOSIS — N329 Bladder disorder, unspecified: Secondary | ICD-10-CM | POA: Diagnosis not present

## 2019-02-08 DIAGNOSIS — R69 Illness, unspecified: Secondary | ICD-10-CM | POA: Diagnosis not present

## 2019-02-08 DIAGNOSIS — R531 Weakness: Secondary | ICD-10-CM | POA: Diagnosis not present

## 2019-02-08 DIAGNOSIS — R262 Difficulty in walking, not elsewhere classified: Secondary | ICD-10-CM | POA: Diagnosis not present

## 2019-02-08 DIAGNOSIS — F4310 Post-traumatic stress disorder, unspecified: Secondary | ICD-10-CM | POA: Diagnosis not present

## 2019-02-08 DIAGNOSIS — R296 Repeated falls: Secondary | ICD-10-CM | POA: Diagnosis not present

## 2019-02-08 DIAGNOSIS — F332 Major depressive disorder, recurrent severe without psychotic features: Secondary | ICD-10-CM | POA: Diagnosis not present

## 2019-02-09 DIAGNOSIS — R296 Repeated falls: Secondary | ICD-10-CM | POA: Diagnosis not present

## 2019-02-09 DIAGNOSIS — R69 Illness, unspecified: Secondary | ICD-10-CM | POA: Diagnosis not present

## 2019-02-09 DIAGNOSIS — R531 Weakness: Secondary | ICD-10-CM | POA: Diagnosis not present

## 2019-02-09 DIAGNOSIS — Z4682 Encounter for fitting and adjustment of non-vascular catheter: Secondary | ICD-10-CM | POA: Diagnosis not present

## 2019-02-09 DIAGNOSIS — K869 Disease of pancreas, unspecified: Secondary | ICD-10-CM | POA: Diagnosis not present

## 2019-02-09 DIAGNOSIS — R262 Difficulty in walking, not elsewhere classified: Secondary | ICD-10-CM | POA: Diagnosis not present

## 2019-02-09 DIAGNOSIS — N329 Bladder disorder, unspecified: Secondary | ICD-10-CM | POA: Diagnosis not present

## 2019-02-10 DIAGNOSIS — R531 Weakness: Secondary | ICD-10-CM | POA: Diagnosis not present

## 2019-02-10 DIAGNOSIS — R69 Illness, unspecified: Secondary | ICD-10-CM | POA: Diagnosis not present

## 2019-02-10 DIAGNOSIS — R296 Repeated falls: Secondary | ICD-10-CM | POA: Diagnosis not present

## 2019-02-10 DIAGNOSIS — N329 Bladder disorder, unspecified: Secondary | ICD-10-CM | POA: Diagnosis not present

## 2019-02-10 DIAGNOSIS — K869 Disease of pancreas, unspecified: Secondary | ICD-10-CM | POA: Diagnosis not present

## 2019-02-10 DIAGNOSIS — R262 Difficulty in walking, not elsewhere classified: Secondary | ICD-10-CM | POA: Diagnosis not present

## 2019-02-11 DIAGNOSIS — N329 Bladder disorder, unspecified: Secondary | ICD-10-CM | POA: Diagnosis not present

## 2019-02-11 DIAGNOSIS — R531 Weakness: Secondary | ICD-10-CM | POA: Diagnosis not present

## 2019-02-11 DIAGNOSIS — K869 Disease of pancreas, unspecified: Secondary | ICD-10-CM | POA: Diagnosis not present

## 2019-02-11 DIAGNOSIS — R69 Illness, unspecified: Secondary | ICD-10-CM | POA: Diagnosis not present

## 2019-02-11 DIAGNOSIS — R296 Repeated falls: Secondary | ICD-10-CM | POA: Diagnosis not present

## 2019-02-11 DIAGNOSIS — R262 Difficulty in walking, not elsewhere classified: Secondary | ICD-10-CM | POA: Diagnosis not present

## 2019-02-12 DIAGNOSIS — K869 Disease of pancreas, unspecified: Secondary | ICD-10-CM | POA: Diagnosis not present

## 2019-02-12 DIAGNOSIS — R296 Repeated falls: Secondary | ICD-10-CM | POA: Diagnosis not present

## 2019-02-12 DIAGNOSIS — R262 Difficulty in walking, not elsewhere classified: Secondary | ICD-10-CM | POA: Diagnosis not present

## 2019-02-12 DIAGNOSIS — R531 Weakness: Secondary | ICD-10-CM | POA: Diagnosis not present

## 2019-02-12 DIAGNOSIS — R69 Illness, unspecified: Secondary | ICD-10-CM | POA: Diagnosis not present

## 2019-02-13 DIAGNOSIS — R296 Repeated falls: Secondary | ICD-10-CM | POA: Diagnosis not present

## 2019-02-13 DIAGNOSIS — K869 Disease of pancreas, unspecified: Secondary | ICD-10-CM | POA: Diagnosis not present

## 2019-02-13 DIAGNOSIS — R262 Difficulty in walking, not elsewhere classified: Secondary | ICD-10-CM | POA: Diagnosis not present

## 2019-02-13 DIAGNOSIS — R531 Weakness: Secondary | ICD-10-CM | POA: Diagnosis not present

## 2019-02-14 DIAGNOSIS — R296 Repeated falls: Secondary | ICD-10-CM | POA: Diagnosis not present

## 2019-02-14 DIAGNOSIS — K869 Disease of pancreas, unspecified: Secondary | ICD-10-CM | POA: Diagnosis not present

## 2019-02-14 DIAGNOSIS — R531 Weakness: Secondary | ICD-10-CM | POA: Diagnosis not present

## 2019-02-14 DIAGNOSIS — R262 Difficulty in walking, not elsewhere classified: Secondary | ICD-10-CM | POA: Diagnosis not present

## 2019-02-15 DIAGNOSIS — R296 Repeated falls: Secondary | ICD-10-CM | POA: Diagnosis not present

## 2019-02-15 DIAGNOSIS — K869 Disease of pancreas, unspecified: Secondary | ICD-10-CM | POA: Diagnosis not present

## 2019-02-15 DIAGNOSIS — R262 Difficulty in walking, not elsewhere classified: Secondary | ICD-10-CM | POA: Diagnosis not present

## 2019-02-15 DIAGNOSIS — R69 Illness, unspecified: Secondary | ICD-10-CM | POA: Diagnosis not present

## 2019-02-15 DIAGNOSIS — R531 Weakness: Secondary | ICD-10-CM | POA: Diagnosis not present

## 2019-02-16 DIAGNOSIS — R531 Weakness: Secondary | ICD-10-CM | POA: Diagnosis not present

## 2019-02-16 DIAGNOSIS — K869 Disease of pancreas, unspecified: Secondary | ICD-10-CM | POA: Diagnosis not present

## 2019-02-16 DIAGNOSIS — R296 Repeated falls: Secondary | ICD-10-CM | POA: Diagnosis not present

## 2019-02-16 DIAGNOSIS — R262 Difficulty in walking, not elsewhere classified: Secondary | ICD-10-CM | POA: Diagnosis not present

## 2019-02-17 DIAGNOSIS — F4321 Adjustment disorder with depressed mood: Secondary | ICD-10-CM | POA: Diagnosis not present

## 2019-02-17 DIAGNOSIS — K869 Disease of pancreas, unspecified: Secondary | ICD-10-CM | POA: Diagnosis not present

## 2019-02-17 DIAGNOSIS — F4310 Post-traumatic stress disorder, unspecified: Secondary | ICD-10-CM | POA: Diagnosis not present

## 2019-02-17 DIAGNOSIS — F332 Major depressive disorder, recurrent severe without psychotic features: Secondary | ICD-10-CM | POA: Diagnosis not present

## 2019-02-17 DIAGNOSIS — R296 Repeated falls: Secondary | ICD-10-CM | POA: Diagnosis not present

## 2019-02-17 DIAGNOSIS — R531 Weakness: Secondary | ICD-10-CM | POA: Diagnosis not present

## 2019-02-17 DIAGNOSIS — R262 Difficulty in walking, not elsewhere classified: Secondary | ICD-10-CM | POA: Diagnosis not present

## 2019-02-17 DIAGNOSIS — R69 Illness, unspecified: Secondary | ICD-10-CM | POA: Diagnosis not present

## 2019-02-18 DIAGNOSIS — R262 Difficulty in walking, not elsewhere classified: Secondary | ICD-10-CM | POA: Diagnosis not present

## 2019-02-18 DIAGNOSIS — N329 Bladder disorder, unspecified: Secondary | ICD-10-CM | POA: Diagnosis not present

## 2019-02-18 DIAGNOSIS — F4321 Adjustment disorder with depressed mood: Secondary | ICD-10-CM | POA: Diagnosis not present

## 2019-02-18 DIAGNOSIS — K869 Disease of pancreas, unspecified: Secondary | ICD-10-CM | POA: Diagnosis not present

## 2019-02-18 DIAGNOSIS — R531 Weakness: Secondary | ICD-10-CM | POA: Diagnosis not present

## 2019-02-18 DIAGNOSIS — R296 Repeated falls: Secondary | ICD-10-CM | POA: Diagnosis not present

## 2019-02-18 DIAGNOSIS — F4310 Post-traumatic stress disorder, unspecified: Secondary | ICD-10-CM | POA: Diagnosis not present

## 2019-02-18 DIAGNOSIS — F332 Major depressive disorder, recurrent severe without psychotic features: Secondary | ICD-10-CM | POA: Diagnosis not present

## 2019-02-18 DIAGNOSIS — R69 Illness, unspecified: Secondary | ICD-10-CM | POA: Diagnosis not present

## 2019-02-19 DIAGNOSIS — N329 Bladder disorder, unspecified: Secondary | ICD-10-CM | POA: Diagnosis not present

## 2019-02-19 DIAGNOSIS — R531 Weakness: Secondary | ICD-10-CM | POA: Diagnosis not present

## 2019-02-19 DIAGNOSIS — R296 Repeated falls: Secondary | ICD-10-CM | POA: Diagnosis not present

## 2019-02-19 DIAGNOSIS — K869 Disease of pancreas, unspecified: Secondary | ICD-10-CM | POA: Diagnosis not present

## 2019-02-19 DIAGNOSIS — R262 Difficulty in walking, not elsewhere classified: Secondary | ICD-10-CM | POA: Diagnosis not present

## 2019-02-19 DIAGNOSIS — R69 Illness, unspecified: Secondary | ICD-10-CM | POA: Diagnosis not present

## 2019-02-20 DIAGNOSIS — R69 Illness, unspecified: Secondary | ICD-10-CM | POA: Diagnosis not present

## 2019-02-20 DIAGNOSIS — R296 Repeated falls: Secondary | ICD-10-CM | POA: Diagnosis not present

## 2019-02-20 DIAGNOSIS — N329 Bladder disorder, unspecified: Secondary | ICD-10-CM | POA: Diagnosis not present

## 2019-02-20 DIAGNOSIS — R262 Difficulty in walking, not elsewhere classified: Secondary | ICD-10-CM | POA: Diagnosis not present

## 2019-02-20 DIAGNOSIS — R531 Weakness: Secondary | ICD-10-CM | POA: Diagnosis not present

## 2019-02-20 DIAGNOSIS — K869 Disease of pancreas, unspecified: Secondary | ICD-10-CM | POA: Diagnosis not present

## 2019-02-21 DIAGNOSIS — R05 Cough: Secondary | ICD-10-CM | POA: Diagnosis not present

## 2019-02-21 DIAGNOSIS — R1310 Dysphagia, unspecified: Secondary | ICD-10-CM | POA: Diagnosis not present

## 2019-02-21 DIAGNOSIS — K8689 Other specified diseases of pancreas: Secondary | ICD-10-CM | POA: Diagnosis not present

## 2019-02-21 DIAGNOSIS — M4802 Spinal stenosis, cervical region: Secondary | ICD-10-CM | POA: Diagnosis not present

## 2019-02-21 DIAGNOSIS — Z720 Tobacco use: Secondary | ICD-10-CM | POA: Diagnosis not present

## 2019-02-21 DIAGNOSIS — R0902 Hypoxemia: Secondary | ICD-10-CM | POA: Diagnosis not present

## 2019-02-21 DIAGNOSIS — R531 Weakness: Secondary | ICD-10-CM | POA: Diagnosis not present

## 2019-02-21 DIAGNOSIS — G992 Myelopathy in diseases classified elsewhere: Secondary | ICD-10-CM | POA: Diagnosis not present

## 2019-02-21 DIAGNOSIS — E039 Hypothyroidism, unspecified: Secondary | ICD-10-CM | POA: Diagnosis not present

## 2019-02-21 DIAGNOSIS — G459 Transient cerebral ischemic attack, unspecified: Secondary | ICD-10-CM | POA: Diagnosis not present

## 2019-02-21 DIAGNOSIS — N329 Bladder disorder, unspecified: Secondary | ICD-10-CM | POA: Diagnosis not present

## 2019-02-21 DIAGNOSIS — M4712 Other spondylosis with myelopathy, cervical region: Secondary | ICD-10-CM | POA: Diagnosis not present

## 2019-02-21 DIAGNOSIS — Z4789 Encounter for other orthopedic aftercare: Secondary | ICD-10-CM | POA: Diagnosis not present

## 2019-02-21 DIAGNOSIS — M542 Cervicalgia: Secondary | ICD-10-CM | POA: Diagnosis not present

## 2019-02-21 DIAGNOSIS — Z20828 Contact with and (suspected) exposure to other viral communicable diseases: Secondary | ICD-10-CM | POA: Diagnosis not present

## 2019-02-21 DIAGNOSIS — K869 Disease of pancreas, unspecified: Secondary | ICD-10-CM | POA: Diagnosis not present

## 2019-02-21 DIAGNOSIS — J449 Chronic obstructive pulmonary disease, unspecified: Secondary | ICD-10-CM | POA: Diagnosis not present

## 2019-02-21 DIAGNOSIS — R69 Illness, unspecified: Secondary | ICD-10-CM | POA: Diagnosis not present

## 2019-02-21 DIAGNOSIS — R262 Difficulty in walking, not elsewhere classified: Secondary | ICD-10-CM | POA: Diagnosis not present

## 2019-02-21 DIAGNOSIS — J690 Pneumonitis due to inhalation of food and vomit: Secondary | ICD-10-CM | POA: Diagnosis not present

## 2019-02-21 DIAGNOSIS — R296 Repeated falls: Secondary | ICD-10-CM | POA: Diagnosis not present

## 2019-02-21 DIAGNOSIS — Z981 Arthrodesis status: Secondary | ICD-10-CM | POA: Diagnosis not present

## 2019-02-21 DIAGNOSIS — G40909 Epilepsy, unspecified, not intractable, without status epilepticus: Secondary | ICD-10-CM | POA: Diagnosis not present

## 2019-02-21 DIAGNOSIS — J189 Pneumonia, unspecified organism: Secondary | ICD-10-CM | POA: Diagnosis not present

## 2019-02-21 DIAGNOSIS — M1990 Unspecified osteoarthritis, unspecified site: Secondary | ICD-10-CM | POA: Diagnosis not present

## 2019-02-21 DIAGNOSIS — R279 Unspecified lack of coordination: Secondary | ICD-10-CM | POA: Diagnosis not present

## 2019-02-21 DIAGNOSIS — N319 Neuromuscular dysfunction of bladder, unspecified: Secondary | ICD-10-CM | POA: Diagnosis not present

## 2019-02-21 DIAGNOSIS — Z743 Need for continuous supervision: Secondary | ICD-10-CM | POA: Diagnosis not present

## 2019-02-21 DIAGNOSIS — R918 Other nonspecific abnormal finding of lung field: Secondary | ICD-10-CM | POA: Diagnosis not present

## 2019-02-21 DIAGNOSIS — G959 Disease of spinal cord, unspecified: Secondary | ICD-10-CM | POA: Diagnosis not present

## 2019-02-21 DIAGNOSIS — K862 Cyst of pancreas: Secondary | ICD-10-CM | POA: Diagnosis not present

## 2019-03-03 DIAGNOSIS — G959 Disease of spinal cord, unspecified: Secondary | ICD-10-CM | POA: Diagnosis not present

## 2019-03-03 DIAGNOSIS — M4802 Spinal stenosis, cervical region: Secondary | ICD-10-CM | POA: Diagnosis not present

## 2019-03-03 DIAGNOSIS — J449 Chronic obstructive pulmonary disease, unspecified: Secondary | ICD-10-CM | POA: Diagnosis not present

## 2019-03-03 DIAGNOSIS — R1310 Dysphagia, unspecified: Secondary | ICD-10-CM | POA: Diagnosis not present

## 2019-03-03 DIAGNOSIS — K8689 Other specified diseases of pancreas: Secondary | ICD-10-CM | POA: Diagnosis not present

## 2019-03-03 DIAGNOSIS — G992 Myelopathy in diseases classified elsewhere: Secondary | ICD-10-CM | POA: Diagnosis not present

## 2019-03-03 DIAGNOSIS — N319 Neuromuscular dysfunction of bladder, unspecified: Secondary | ICD-10-CM | POA: Diagnosis not present

## 2019-03-03 DIAGNOSIS — R296 Repeated falls: Secondary | ICD-10-CM | POA: Diagnosis not present

## 2019-03-03 DIAGNOSIS — R918 Other nonspecific abnormal finding of lung field: Secondary | ICD-10-CM | POA: Diagnosis not present

## 2019-03-03 DIAGNOSIS — R633 Feeding difficulties: Secondary | ICD-10-CM | POA: Diagnosis not present

## 2019-03-03 DIAGNOSIS — G40909 Epilepsy, unspecified, not intractable, without status epilepticus: Secondary | ICD-10-CM | POA: Diagnosis not present

## 2019-03-03 DIAGNOSIS — R279 Unspecified lack of coordination: Secondary | ICD-10-CM | POA: Diagnosis not present

## 2019-03-03 DIAGNOSIS — Z743 Need for continuous supervision: Secondary | ICD-10-CM | POA: Diagnosis not present

## 2019-03-03 DIAGNOSIS — R0902 Hypoxemia: Secondary | ICD-10-CM | POA: Diagnosis not present

## 2019-03-03 DIAGNOSIS — T17320A Food in larynx causing asphyxiation, initial encounter: Secondary | ICD-10-CM | POA: Diagnosis not present

## 2019-03-03 DIAGNOSIS — M1990 Unspecified osteoarthritis, unspecified site: Secondary | ICD-10-CM | POA: Diagnosis not present

## 2019-03-03 DIAGNOSIS — R1312 Dysphagia, oropharyngeal phase: Secondary | ICD-10-CM | POA: Diagnosis not present

## 2019-03-03 DIAGNOSIS — J690 Pneumonitis due to inhalation of food and vomit: Secondary | ICD-10-CM | POA: Diagnosis not present

## 2019-03-03 DIAGNOSIS — K869 Disease of pancreas, unspecified: Secondary | ICD-10-CM | POA: Diagnosis not present

## 2019-03-03 DIAGNOSIS — Z431 Encounter for attention to gastrostomy: Secondary | ICD-10-CM | POA: Diagnosis not present

## 2019-03-03 DIAGNOSIS — J189 Pneumonia, unspecified organism: Secondary | ICD-10-CM | POA: Diagnosis not present

## 2019-03-03 DIAGNOSIS — R05 Cough: Secondary | ICD-10-CM | POA: Diagnosis not present

## 2019-03-03 DIAGNOSIS — E039 Hypothyroidism, unspecified: Secondary | ICD-10-CM | POA: Diagnosis not present

## 2019-03-03 DIAGNOSIS — K862 Cyst of pancreas: Secondary | ICD-10-CM | POA: Diagnosis not present

## 2019-03-03 DIAGNOSIS — Z720 Tobacco use: Secondary | ICD-10-CM | POA: Diagnosis not present

## 2019-03-03 DIAGNOSIS — Z20828 Contact with and (suspected) exposure to other viral communicable diseases: Secondary | ICD-10-CM | POA: Diagnosis not present

## 2019-03-03 DIAGNOSIS — R69 Illness, unspecified: Secondary | ICD-10-CM | POA: Diagnosis not present

## 2019-03-13 DIAGNOSIS — Z743 Need for continuous supervision: Secondary | ICD-10-CM | POA: Diagnosis not present

## 2019-03-13 DIAGNOSIS — K869 Disease of pancreas, unspecified: Secondary | ICD-10-CM | POA: Diagnosis not present

## 2019-03-13 DIAGNOSIS — Z720 Tobacco use: Secondary | ICD-10-CM | POA: Diagnosis not present

## 2019-03-13 DIAGNOSIS — M25512 Pain in left shoulder: Secondary | ICD-10-CM | POA: Diagnosis not present

## 2019-03-13 DIAGNOSIS — R1310 Dysphagia, unspecified: Secondary | ICD-10-CM | POA: Diagnosis not present

## 2019-03-13 DIAGNOSIS — M5002 Cervical disc disorder with myelopathy, mid-cervical region, unspecified level: Secondary | ICD-10-CM | POA: Diagnosis not present

## 2019-03-13 DIAGNOSIS — R2689 Other abnormalities of gait and mobility: Secondary | ICD-10-CM | POA: Diagnosis not present

## 2019-03-13 DIAGNOSIS — R279 Unspecified lack of coordination: Secondary | ICD-10-CM | POA: Diagnosis not present

## 2019-03-13 DIAGNOSIS — J690 Pneumonitis due to inhalation of food and vomit: Secondary | ICD-10-CM | POA: Diagnosis not present

## 2019-03-13 DIAGNOSIS — R296 Repeated falls: Secondary | ICD-10-CM | POA: Diagnosis not present

## 2019-03-13 DIAGNOSIS — E039 Hypothyroidism, unspecified: Secondary | ICD-10-CM | POA: Diagnosis not present

## 2019-03-13 DIAGNOSIS — Z4789 Encounter for other orthopedic aftercare: Secondary | ICD-10-CM | POA: Diagnosis not present

## 2019-03-13 DIAGNOSIS — G40909 Epilepsy, unspecified, not intractable, without status epilepticus: Secondary | ICD-10-CM | POA: Diagnosis not present

## 2019-03-13 DIAGNOSIS — R0902 Hypoxemia: Secondary | ICD-10-CM | POA: Diagnosis not present

## 2019-03-13 DIAGNOSIS — R05 Cough: Secondary | ICD-10-CM | POA: Diagnosis not present

## 2019-03-13 DIAGNOSIS — M542 Cervicalgia: Secondary | ICD-10-CM | POA: Diagnosis not present

## 2019-03-13 DIAGNOSIS — Z981 Arthrodesis status: Secondary | ICD-10-CM | POA: Diagnosis not present

## 2019-03-13 DIAGNOSIS — R69 Illness, unspecified: Secondary | ICD-10-CM | POA: Diagnosis not present

## 2019-03-13 DIAGNOSIS — R1319 Other dysphagia: Secondary | ICD-10-CM | POA: Diagnosis not present

## 2019-03-13 DIAGNOSIS — M961 Postlaminectomy syndrome, not elsewhere classified: Secondary | ICD-10-CM | POA: Diagnosis not present

## 2019-03-13 DIAGNOSIS — Z431 Encounter for attention to gastrostomy: Secondary | ICD-10-CM | POA: Diagnosis not present

## 2019-03-13 DIAGNOSIS — R1312 Dysphagia, oropharyngeal phase: Secondary | ICD-10-CM | POA: Diagnosis not present

## 2019-03-13 DIAGNOSIS — C259 Malignant neoplasm of pancreas, unspecified: Secondary | ICD-10-CM | POA: Diagnosis not present

## 2019-03-13 DIAGNOSIS — G7281 Critical illness myopathy: Secondary | ICD-10-CM | POA: Diagnosis not present

## 2019-03-13 DIAGNOSIS — G992 Myelopathy in diseases classified elsewhere: Secondary | ICD-10-CM | POA: Diagnosis not present

## 2019-03-13 DIAGNOSIS — R278 Other lack of coordination: Secondary | ICD-10-CM | POA: Diagnosis not present

## 2019-03-13 DIAGNOSIS — M6281 Muscle weakness (generalized): Secondary | ICD-10-CM | POA: Diagnosis not present

## 2019-03-13 DIAGNOSIS — R1313 Dysphagia, pharyngeal phase: Secondary | ICD-10-CM | POA: Diagnosis not present

## 2019-03-13 DIAGNOSIS — S34109A Unspecified injury to unspecified level of lumbar spinal cord, initial encounter: Secondary | ICD-10-CM | POA: Diagnosis not present

## 2019-03-13 DIAGNOSIS — G8929 Other chronic pain: Secondary | ICD-10-CM | POA: Diagnosis not present

## 2019-03-13 DIAGNOSIS — M25561 Pain in right knee: Secondary | ICD-10-CM | POA: Diagnosis not present

## 2019-03-13 DIAGNOSIS — K862 Cyst of pancreas: Secondary | ICD-10-CM | POA: Diagnosis not present

## 2019-03-13 DIAGNOSIS — M4802 Spinal stenosis, cervical region: Secondary | ICD-10-CM | POA: Diagnosis not present

## 2019-03-14 DIAGNOSIS — M4802 Spinal stenosis, cervical region: Secondary | ICD-10-CM | POA: Diagnosis not present

## 2019-03-14 DIAGNOSIS — G40909 Epilepsy, unspecified, not intractable, without status epilepticus: Secondary | ICD-10-CM | POA: Diagnosis not present

## 2019-03-14 DIAGNOSIS — Z431 Encounter for attention to gastrostomy: Secondary | ICD-10-CM | POA: Diagnosis not present

## 2019-03-14 DIAGNOSIS — M25512 Pain in left shoulder: Secondary | ICD-10-CM | POA: Diagnosis not present

## 2019-03-14 DIAGNOSIS — R69 Illness, unspecified: Secondary | ICD-10-CM | POA: Diagnosis not present

## 2019-03-14 DIAGNOSIS — J690 Pneumonitis due to inhalation of food and vomit: Secondary | ICD-10-CM | POA: Diagnosis not present

## 2019-03-14 DIAGNOSIS — G992 Myelopathy in diseases classified elsewhere: Secondary | ICD-10-CM | POA: Diagnosis not present

## 2019-03-14 DIAGNOSIS — R1312 Dysphagia, oropharyngeal phase: Secondary | ICD-10-CM | POA: Diagnosis not present

## 2019-03-14 DIAGNOSIS — E039 Hypothyroidism, unspecified: Secondary | ICD-10-CM | POA: Diagnosis not present

## 2019-03-14 DIAGNOSIS — R296 Repeated falls: Secondary | ICD-10-CM | POA: Diagnosis not present

## 2019-03-15 DIAGNOSIS — M25512 Pain in left shoulder: Secondary | ICD-10-CM | POA: Diagnosis not present

## 2019-03-15 DIAGNOSIS — R69 Illness, unspecified: Secondary | ICD-10-CM | POA: Diagnosis not present

## 2019-03-15 DIAGNOSIS — G40909 Epilepsy, unspecified, not intractable, without status epilepticus: Secondary | ICD-10-CM | POA: Diagnosis not present

## 2019-03-15 DIAGNOSIS — R296 Repeated falls: Secondary | ICD-10-CM | POA: Diagnosis not present

## 2019-03-15 DIAGNOSIS — M4802 Spinal stenosis, cervical region: Secondary | ICD-10-CM | POA: Diagnosis not present

## 2019-03-15 DIAGNOSIS — G992 Myelopathy in diseases classified elsewhere: Secondary | ICD-10-CM | POA: Diagnosis not present

## 2019-03-16 DIAGNOSIS — M4802 Spinal stenosis, cervical region: Secondary | ICD-10-CM | POA: Diagnosis not present

## 2019-03-16 DIAGNOSIS — M25512 Pain in left shoulder: Secondary | ICD-10-CM | POA: Diagnosis not present

## 2019-03-16 DIAGNOSIS — R296 Repeated falls: Secondary | ICD-10-CM | POA: Diagnosis not present

## 2019-03-16 DIAGNOSIS — G992 Myelopathy in diseases classified elsewhere: Secondary | ICD-10-CM | POA: Diagnosis not present

## 2019-03-16 DIAGNOSIS — R69 Illness, unspecified: Secondary | ICD-10-CM | POA: Diagnosis not present

## 2019-03-16 DIAGNOSIS — G40909 Epilepsy, unspecified, not intractable, without status epilepticus: Secondary | ICD-10-CM | POA: Diagnosis not present

## 2019-03-17 DIAGNOSIS — G992 Myelopathy in diseases classified elsewhere: Secondary | ICD-10-CM | POA: Diagnosis not present

## 2019-03-17 DIAGNOSIS — G40909 Epilepsy, unspecified, not intractable, without status epilepticus: Secondary | ICD-10-CM | POA: Diagnosis not present

## 2019-03-17 DIAGNOSIS — R69 Illness, unspecified: Secondary | ICD-10-CM | POA: Diagnosis not present

## 2019-03-17 DIAGNOSIS — M25512 Pain in left shoulder: Secondary | ICD-10-CM | POA: Diagnosis not present

## 2019-03-17 DIAGNOSIS — R296 Repeated falls: Secondary | ICD-10-CM | POA: Diagnosis not present

## 2019-03-17 DIAGNOSIS — M4802 Spinal stenosis, cervical region: Secondary | ICD-10-CM | POA: Diagnosis not present

## 2019-03-18 DIAGNOSIS — M25512 Pain in left shoulder: Secondary | ICD-10-CM | POA: Diagnosis not present

## 2019-03-18 DIAGNOSIS — R69 Illness, unspecified: Secondary | ICD-10-CM | POA: Diagnosis not present

## 2019-03-18 DIAGNOSIS — G992 Myelopathy in diseases classified elsewhere: Secondary | ICD-10-CM | POA: Diagnosis not present

## 2019-03-18 DIAGNOSIS — R296 Repeated falls: Secondary | ICD-10-CM | POA: Diagnosis not present

## 2019-03-18 DIAGNOSIS — G40909 Epilepsy, unspecified, not intractable, without status epilepticus: Secondary | ICD-10-CM | POA: Diagnosis not present

## 2019-03-18 DIAGNOSIS — M4802 Spinal stenosis, cervical region: Secondary | ICD-10-CM | POA: Diagnosis not present

## 2019-03-19 DIAGNOSIS — M4802 Spinal stenosis, cervical region: Secondary | ICD-10-CM | POA: Diagnosis not present

## 2019-03-19 DIAGNOSIS — G992 Myelopathy in diseases classified elsewhere: Secondary | ICD-10-CM | POA: Diagnosis not present

## 2019-03-19 DIAGNOSIS — G40909 Epilepsy, unspecified, not intractable, without status epilepticus: Secondary | ICD-10-CM | POA: Diagnosis not present

## 2019-03-19 DIAGNOSIS — R69 Illness, unspecified: Secondary | ICD-10-CM | POA: Diagnosis not present

## 2019-03-19 DIAGNOSIS — M25512 Pain in left shoulder: Secondary | ICD-10-CM | POA: Diagnosis not present

## 2019-03-19 DIAGNOSIS — R296 Repeated falls: Secondary | ICD-10-CM | POA: Diagnosis not present

## 2019-03-22 DIAGNOSIS — G40909 Epilepsy, unspecified, not intractable, without status epilepticus: Secondary | ICD-10-CM | POA: Diagnosis not present

## 2019-03-22 DIAGNOSIS — R296 Repeated falls: Secondary | ICD-10-CM | POA: Diagnosis not present

## 2019-03-22 DIAGNOSIS — M4802 Spinal stenosis, cervical region: Secondary | ICD-10-CM | POA: Diagnosis not present

## 2019-03-22 DIAGNOSIS — G992 Myelopathy in diseases classified elsewhere: Secondary | ICD-10-CM | POA: Diagnosis not present

## 2019-03-22 DIAGNOSIS — M25512 Pain in left shoulder: Secondary | ICD-10-CM | POA: Diagnosis not present

## 2019-03-22 DIAGNOSIS — R69 Illness, unspecified: Secondary | ICD-10-CM | POA: Diagnosis not present

## 2019-03-23 DIAGNOSIS — G992 Myelopathy in diseases classified elsewhere: Secondary | ICD-10-CM | POA: Diagnosis not present

## 2019-03-23 DIAGNOSIS — G40909 Epilepsy, unspecified, not intractable, without status epilepticus: Secondary | ICD-10-CM | POA: Diagnosis not present

## 2019-03-23 DIAGNOSIS — R69 Illness, unspecified: Secondary | ICD-10-CM | POA: Diagnosis not present

## 2019-03-23 DIAGNOSIS — M4802 Spinal stenosis, cervical region: Secondary | ICD-10-CM | POA: Diagnosis not present

## 2019-03-23 DIAGNOSIS — M25512 Pain in left shoulder: Secondary | ICD-10-CM | POA: Diagnosis not present

## 2019-03-23 DIAGNOSIS — R296 Repeated falls: Secondary | ICD-10-CM | POA: Diagnosis not present

## 2019-03-24 DIAGNOSIS — M4802 Spinal stenosis, cervical region: Secondary | ICD-10-CM | POA: Diagnosis not present

## 2019-03-24 DIAGNOSIS — M25512 Pain in left shoulder: Secondary | ICD-10-CM | POA: Diagnosis not present

## 2019-03-24 DIAGNOSIS — G40909 Epilepsy, unspecified, not intractable, without status epilepticus: Secondary | ICD-10-CM | POA: Diagnosis not present

## 2019-03-24 DIAGNOSIS — R69 Illness, unspecified: Secondary | ICD-10-CM | POA: Diagnosis not present

## 2019-03-24 DIAGNOSIS — M25561 Pain in right knee: Secondary | ICD-10-CM | POA: Diagnosis not present

## 2019-03-24 DIAGNOSIS — R296 Repeated falls: Secondary | ICD-10-CM | POA: Diagnosis not present

## 2019-03-24 DIAGNOSIS — G992 Myelopathy in diseases classified elsewhere: Secondary | ICD-10-CM | POA: Diagnosis not present

## 2019-03-25 DIAGNOSIS — M25512 Pain in left shoulder: Secondary | ICD-10-CM | POA: Diagnosis not present

## 2019-03-25 DIAGNOSIS — R69 Illness, unspecified: Secondary | ICD-10-CM | POA: Diagnosis not present

## 2019-03-25 DIAGNOSIS — G40909 Epilepsy, unspecified, not intractable, without status epilepticus: Secondary | ICD-10-CM | POA: Diagnosis not present

## 2019-03-25 DIAGNOSIS — R296 Repeated falls: Secondary | ICD-10-CM | POA: Diagnosis not present

## 2019-03-25 DIAGNOSIS — J690 Pneumonitis due to inhalation of food and vomit: Secondary | ICD-10-CM | POA: Diagnosis not present

## 2019-03-25 DIAGNOSIS — G992 Myelopathy in diseases classified elsewhere: Secondary | ICD-10-CM | POA: Diagnosis not present

## 2019-03-25 DIAGNOSIS — M4802 Spinal stenosis, cervical region: Secondary | ICD-10-CM | POA: Diagnosis not present

## 2019-03-26 DIAGNOSIS — M4802 Spinal stenosis, cervical region: Secondary | ICD-10-CM | POA: Diagnosis not present

## 2019-03-26 DIAGNOSIS — G40909 Epilepsy, unspecified, not intractable, without status epilepticus: Secondary | ICD-10-CM | POA: Diagnosis not present

## 2019-03-26 DIAGNOSIS — M25512 Pain in left shoulder: Secondary | ICD-10-CM | POA: Diagnosis not present

## 2019-03-26 DIAGNOSIS — R69 Illness, unspecified: Secondary | ICD-10-CM | POA: Diagnosis not present

## 2019-03-26 DIAGNOSIS — G992 Myelopathy in diseases classified elsewhere: Secondary | ICD-10-CM | POA: Diagnosis not present

## 2019-03-26 DIAGNOSIS — R296 Repeated falls: Secondary | ICD-10-CM | POA: Diagnosis not present

## 2019-03-29 DIAGNOSIS — M25512 Pain in left shoulder: Secondary | ICD-10-CM | POA: Diagnosis not present

## 2019-03-29 DIAGNOSIS — G992 Myelopathy in diseases classified elsewhere: Secondary | ICD-10-CM | POA: Diagnosis not present

## 2019-03-29 DIAGNOSIS — G40909 Epilepsy, unspecified, not intractable, without status epilepticus: Secondary | ICD-10-CM | POA: Diagnosis not present

## 2019-03-29 DIAGNOSIS — R69 Illness, unspecified: Secondary | ICD-10-CM | POA: Diagnosis not present

## 2019-03-29 DIAGNOSIS — M4802 Spinal stenosis, cervical region: Secondary | ICD-10-CM | POA: Diagnosis not present

## 2019-03-29 DIAGNOSIS — R296 Repeated falls: Secondary | ICD-10-CM | POA: Diagnosis not present

## 2019-03-30 DIAGNOSIS — M4802 Spinal stenosis, cervical region: Secondary | ICD-10-CM | POA: Diagnosis not present

## 2019-03-30 DIAGNOSIS — G992 Myelopathy in diseases classified elsewhere: Secondary | ICD-10-CM | POA: Diagnosis not present

## 2019-03-30 DIAGNOSIS — G40909 Epilepsy, unspecified, not intractable, without status epilepticus: Secondary | ICD-10-CM | POA: Diagnosis not present

## 2019-03-30 DIAGNOSIS — R69 Illness, unspecified: Secondary | ICD-10-CM | POA: Diagnosis not present

## 2019-03-30 DIAGNOSIS — M25512 Pain in left shoulder: Secondary | ICD-10-CM | POA: Diagnosis not present

## 2019-03-30 DIAGNOSIS — R296 Repeated falls: Secondary | ICD-10-CM | POA: Diagnosis not present

## 2019-03-31 DIAGNOSIS — R296 Repeated falls: Secondary | ICD-10-CM | POA: Diagnosis not present

## 2019-03-31 DIAGNOSIS — G992 Myelopathy in diseases classified elsewhere: Secondary | ICD-10-CM | POA: Diagnosis not present

## 2019-03-31 DIAGNOSIS — M25512 Pain in left shoulder: Secondary | ICD-10-CM | POA: Diagnosis not present

## 2019-03-31 DIAGNOSIS — R1310 Dysphagia, unspecified: Secondary | ICD-10-CM | POA: Diagnosis not present

## 2019-03-31 DIAGNOSIS — M4802 Spinal stenosis, cervical region: Secondary | ICD-10-CM | POA: Diagnosis not present

## 2019-03-31 DIAGNOSIS — G40909 Epilepsy, unspecified, not intractable, without status epilepticus: Secondary | ICD-10-CM | POA: Diagnosis not present

## 2019-03-31 DIAGNOSIS — R69 Illness, unspecified: Secondary | ICD-10-CM | POA: Diagnosis not present

## 2019-03-31 DIAGNOSIS — R05 Cough: Secondary | ICD-10-CM | POA: Diagnosis not present

## 2019-04-01 DIAGNOSIS — R296 Repeated falls: Secondary | ICD-10-CM | POA: Diagnosis not present

## 2019-04-01 DIAGNOSIS — G40909 Epilepsy, unspecified, not intractable, without status epilepticus: Secondary | ICD-10-CM | POA: Diagnosis not present

## 2019-04-01 DIAGNOSIS — M25512 Pain in left shoulder: Secondary | ICD-10-CM | POA: Diagnosis not present

## 2019-04-01 DIAGNOSIS — G992 Myelopathy in diseases classified elsewhere: Secondary | ICD-10-CM | POA: Diagnosis not present

## 2019-04-01 DIAGNOSIS — R69 Illness, unspecified: Secondary | ICD-10-CM | POA: Diagnosis not present

## 2019-04-01 DIAGNOSIS — M4802 Spinal stenosis, cervical region: Secondary | ICD-10-CM | POA: Diagnosis not present

## 2019-04-02 DIAGNOSIS — M4802 Spinal stenosis, cervical region: Secondary | ICD-10-CM | POA: Diagnosis not present

## 2019-04-02 DIAGNOSIS — R296 Repeated falls: Secondary | ICD-10-CM | POA: Diagnosis not present

## 2019-04-02 DIAGNOSIS — G992 Myelopathy in diseases classified elsewhere: Secondary | ICD-10-CM | POA: Diagnosis not present

## 2019-04-02 DIAGNOSIS — M25512 Pain in left shoulder: Secondary | ICD-10-CM | POA: Diagnosis not present

## 2019-04-02 DIAGNOSIS — R69 Illness, unspecified: Secondary | ICD-10-CM | POA: Diagnosis not present

## 2019-04-02 DIAGNOSIS — G40909 Epilepsy, unspecified, not intractable, without status epilepticus: Secondary | ICD-10-CM | POA: Diagnosis not present

## 2019-04-05 DIAGNOSIS — Z1159 Encounter for screening for other viral diseases: Secondary | ICD-10-CM | POA: Diagnosis not present

## 2019-04-05 DIAGNOSIS — R1313 Dysphagia, pharyngeal phase: Secondary | ICD-10-CM | POA: Diagnosis not present

## 2019-04-05 DIAGNOSIS — M4802 Spinal stenosis, cervical region: Secondary | ICD-10-CM | POA: Diagnosis not present

## 2019-04-05 DIAGNOSIS — C259 Malignant neoplasm of pancreas, unspecified: Secondary | ICD-10-CM | POA: Diagnosis not present

## 2019-04-05 DIAGNOSIS — R1310 Dysphagia, unspecified: Secondary | ICD-10-CM | POA: Diagnosis not present

## 2019-04-05 DIAGNOSIS — R278 Other lack of coordination: Secondary | ICD-10-CM | POA: Diagnosis not present

## 2019-04-05 DIAGNOSIS — R69 Illness, unspecified: Secondary | ICD-10-CM | POA: Diagnosis not present

## 2019-04-05 DIAGNOSIS — S34109A Unspecified injury to unspecified level of lumbar spinal cord, initial encounter: Secondary | ICD-10-CM | POA: Diagnosis not present

## 2019-04-05 DIAGNOSIS — R296 Repeated falls: Secondary | ICD-10-CM | POA: Diagnosis not present

## 2019-04-05 DIAGNOSIS — G992 Myelopathy in diseases classified elsewhere: Secondary | ICD-10-CM | POA: Diagnosis not present

## 2019-04-05 DIAGNOSIS — M5002 Cervical disc disorder with myelopathy, mid-cervical region, unspecified level: Secondary | ICD-10-CM | POA: Diagnosis not present

## 2019-04-05 DIAGNOSIS — J449 Chronic obstructive pulmonary disease, unspecified: Secondary | ICD-10-CM | POA: Diagnosis not present

## 2019-04-05 DIAGNOSIS — R2689 Other abnormalities of gait and mobility: Secondary | ICD-10-CM | POA: Diagnosis not present

## 2019-04-05 DIAGNOSIS — Z4789 Encounter for other orthopedic aftercare: Secondary | ICD-10-CM | POA: Diagnosis not present

## 2019-04-05 DIAGNOSIS — J690 Pneumonitis due to inhalation of food and vomit: Secondary | ICD-10-CM | POA: Diagnosis not present

## 2019-04-05 DIAGNOSIS — I10 Essential (primary) hypertension: Secondary | ICD-10-CM | POA: Diagnosis not present

## 2019-04-05 DIAGNOSIS — M6281 Muscle weakness (generalized): Secondary | ICD-10-CM | POA: Diagnosis not present

## 2019-04-08 DIAGNOSIS — J449 Chronic obstructive pulmonary disease, unspecified: Secondary | ICD-10-CM | POA: Diagnosis not present

## 2019-04-08 DIAGNOSIS — R1310 Dysphagia, unspecified: Secondary | ICD-10-CM | POA: Diagnosis not present

## 2019-04-08 DIAGNOSIS — I10 Essential (primary) hypertension: Secondary | ICD-10-CM | POA: Diagnosis not present

## 2019-04-14 DIAGNOSIS — R1310 Dysphagia, unspecified: Secondary | ICD-10-CM | POA: Diagnosis not present

## 2019-04-14 DIAGNOSIS — J449 Chronic obstructive pulmonary disease, unspecified: Secondary | ICD-10-CM | POA: Diagnosis not present

## 2019-04-14 DIAGNOSIS — R69 Illness, unspecified: Secondary | ICD-10-CM | POA: Diagnosis not present

## 2019-04-14 DIAGNOSIS — I10 Essential (primary) hypertension: Secondary | ICD-10-CM | POA: Diagnosis not present

## 2019-04-30 DIAGNOSIS — F332 Major depressive disorder, recurrent severe without psychotic features: Secondary | ICD-10-CM | POA: Diagnosis not present

## 2019-04-30 DIAGNOSIS — R69 Illness, unspecified: Secondary | ICD-10-CM | POA: Diagnosis not present

## 2019-04-30 DIAGNOSIS — F411 Generalized anxiety disorder: Secondary | ICD-10-CM | POA: Diagnosis not present

## 2019-05-04 DIAGNOSIS — F4312 Post-traumatic stress disorder, chronic: Secondary | ICD-10-CM | POA: Diagnosis not present

## 2019-05-04 DIAGNOSIS — R69 Illness, unspecified: Secondary | ICD-10-CM | POA: Diagnosis not present

## 2019-05-04 DIAGNOSIS — F418 Other specified anxiety disorders: Secondary | ICD-10-CM | POA: Diagnosis not present

## 2019-05-04 DIAGNOSIS — F5105 Insomnia due to other mental disorder: Secondary | ICD-10-CM | POA: Diagnosis not present

## 2019-05-05 DIAGNOSIS — R69 Illness, unspecified: Secondary | ICD-10-CM | POA: Diagnosis not present

## 2019-05-05 DIAGNOSIS — F332 Major depressive disorder, recurrent severe without psychotic features: Secondary | ICD-10-CM | POA: Diagnosis not present

## 2019-05-05 DIAGNOSIS — M25561 Pain in right knee: Secondary | ICD-10-CM | POA: Diagnosis not present

## 2019-05-05 DIAGNOSIS — F411 Generalized anxiety disorder: Secondary | ICD-10-CM | POA: Diagnosis not present

## 2019-05-11 DIAGNOSIS — Z981 Arthrodesis status: Secondary | ICD-10-CM | POA: Diagnosis not present

## 2019-05-11 DIAGNOSIS — G8929 Other chronic pain: Secondary | ICD-10-CM | POA: Diagnosis not present

## 2019-05-11 DIAGNOSIS — G992 Myelopathy in diseases classified elsewhere: Secondary | ICD-10-CM | POA: Diagnosis not present

## 2019-05-11 DIAGNOSIS — M545 Low back pain: Secondary | ICD-10-CM | POA: Diagnosis not present

## 2019-05-11 DIAGNOSIS — R1310 Dysphagia, unspecified: Secondary | ICD-10-CM | POA: Diagnosis not present

## 2019-05-11 DIAGNOSIS — M4802 Spinal stenosis, cervical region: Secondary | ICD-10-CM | POA: Diagnosis not present

## 2019-05-11 DIAGNOSIS — M542 Cervicalgia: Secondary | ICD-10-CM | POA: Diagnosis not present

## 2019-05-13 DIAGNOSIS — I10 Essential (primary) hypertension: Secondary | ICD-10-CM | POA: Diagnosis not present

## 2019-05-13 DIAGNOSIS — R1310 Dysphagia, unspecified: Secondary | ICD-10-CM | POA: Diagnosis not present

## 2019-05-13 DIAGNOSIS — J449 Chronic obstructive pulmonary disease, unspecified: Secondary | ICD-10-CM | POA: Diagnosis not present

## 2019-05-31 DIAGNOSIS — R1310 Dysphagia, unspecified: Secondary | ICD-10-CM | POA: Diagnosis not present

## 2019-05-31 DIAGNOSIS — R69 Illness, unspecified: Secondary | ICD-10-CM | POA: Diagnosis not present

## 2019-05-31 DIAGNOSIS — J449 Chronic obstructive pulmonary disease, unspecified: Secondary | ICD-10-CM | POA: Diagnosis not present

## 2019-06-08 DIAGNOSIS — R1310 Dysphagia, unspecified: Secondary | ICD-10-CM | POA: Diagnosis not present

## 2019-06-08 DIAGNOSIS — F418 Other specified anxiety disorders: Secondary | ICD-10-CM | POA: Diagnosis not present

## 2019-06-08 DIAGNOSIS — R69 Illness, unspecified: Secondary | ICD-10-CM | POA: Diagnosis not present

## 2019-06-08 DIAGNOSIS — F5105 Insomnia due to other mental disorder: Secondary | ICD-10-CM | POA: Diagnosis not present

## 2019-06-08 DIAGNOSIS — R633 Feeding difficulties: Secondary | ICD-10-CM | POA: Diagnosis not present

## 2019-06-08 DIAGNOSIS — F4312 Post-traumatic stress disorder, chronic: Secondary | ICD-10-CM | POA: Diagnosis not present

## 2019-06-10 DIAGNOSIS — I10 Essential (primary) hypertension: Secondary | ICD-10-CM | POA: Diagnosis not present

## 2019-06-10 DIAGNOSIS — J449 Chronic obstructive pulmonary disease, unspecified: Secondary | ICD-10-CM | POA: Diagnosis not present

## 2019-06-10 DIAGNOSIS — E569 Vitamin deficiency, unspecified: Secondary | ICD-10-CM | POA: Diagnosis not present

## 2019-06-10 DIAGNOSIS — G40909 Epilepsy, unspecified, not intractable, without status epilepticus: Secondary | ICD-10-CM | POA: Diagnosis not present

## 2019-06-16 DIAGNOSIS — J449 Chronic obstructive pulmonary disease, unspecified: Secondary | ICD-10-CM | POA: Diagnosis not present

## 2019-06-16 DIAGNOSIS — R1310 Dysphagia, unspecified: Secondary | ICD-10-CM | POA: Diagnosis not present

## 2019-06-16 DIAGNOSIS — I10 Essential (primary) hypertension: Secondary | ICD-10-CM | POA: Diagnosis not present

## 2019-06-16 DIAGNOSIS — G40909 Epilepsy, unspecified, not intractable, without status epilepticus: Secondary | ICD-10-CM | POA: Diagnosis not present

## 2019-07-02 DIAGNOSIS — R1310 Dysphagia, unspecified: Secondary | ICD-10-CM | POA: Diagnosis not present

## 2019-07-02 DIAGNOSIS — K869 Disease of pancreas, unspecified: Secondary | ICD-10-CM | POA: Diagnosis not present

## 2019-07-02 DIAGNOSIS — F4321 Adjustment disorder with depressed mood: Secondary | ICD-10-CM | POA: Diagnosis not present

## 2019-07-02 DIAGNOSIS — G40909 Epilepsy, unspecified, not intractable, without status epilepticus: Secondary | ICD-10-CM | POA: Diagnosis not present

## 2019-07-02 DIAGNOSIS — R69 Illness, unspecified: Secondary | ICD-10-CM | POA: Diagnosis not present

## 2019-07-02 DIAGNOSIS — N319 Neuromuscular dysfunction of bladder, unspecified: Secondary | ICD-10-CM | POA: Diagnosis not present

## 2019-07-02 DIAGNOSIS — F4310 Post-traumatic stress disorder, unspecified: Secondary | ICD-10-CM | POA: Diagnosis not present

## 2019-07-02 DIAGNOSIS — S069X9S Unspecified intracranial injury with loss of consciousness of unspecified duration, sequela: Secondary | ICD-10-CM | POA: Diagnosis not present

## 2019-07-02 DIAGNOSIS — M4802 Spinal stenosis, cervical region: Secondary | ICD-10-CM | POA: Diagnosis not present

## 2019-07-02 DIAGNOSIS — R42 Dizziness and giddiness: Secondary | ICD-10-CM | POA: Diagnosis not present

## 2019-07-02 DIAGNOSIS — Z931 Gastrostomy status: Secondary | ICD-10-CM | POA: Diagnosis not present

## 2019-07-05 DIAGNOSIS — M5000 Cervical disc disorder with myelopathy, unspecified cervical region: Secondary | ICD-10-CM | POA: Diagnosis not present

## 2019-07-05 DIAGNOSIS — Z431 Encounter for attention to gastrostomy: Secondary | ICD-10-CM | POA: Diagnosis not present

## 2019-07-05 DIAGNOSIS — N319 Neuromuscular dysfunction of bladder, unspecified: Secondary | ICD-10-CM | POA: Diagnosis not present

## 2019-07-05 DIAGNOSIS — R1310 Dysphagia, unspecified: Secondary | ICD-10-CM | POA: Diagnosis not present

## 2019-07-07 DIAGNOSIS — Z431 Encounter for attention to gastrostomy: Secondary | ICD-10-CM | POA: Diagnosis not present

## 2019-07-07 DIAGNOSIS — M5000 Cervical disc disorder with myelopathy, unspecified cervical region: Secondary | ICD-10-CM | POA: Diagnosis not present

## 2019-07-07 DIAGNOSIS — N319 Neuromuscular dysfunction of bladder, unspecified: Secondary | ICD-10-CM | POA: Diagnosis not present

## 2019-07-07 DIAGNOSIS — R1310 Dysphagia, unspecified: Secondary | ICD-10-CM | POA: Diagnosis not present

## 2019-07-09 DIAGNOSIS — R1310 Dysphagia, unspecified: Secondary | ICD-10-CM | POA: Diagnosis not present

## 2019-07-09 DIAGNOSIS — Z431 Encounter for attention to gastrostomy: Secondary | ICD-10-CM | POA: Diagnosis not present

## 2019-07-09 DIAGNOSIS — M5000 Cervical disc disorder with myelopathy, unspecified cervical region: Secondary | ICD-10-CM | POA: Diagnosis not present

## 2019-07-09 DIAGNOSIS — N319 Neuromuscular dysfunction of bladder, unspecified: Secondary | ICD-10-CM | POA: Diagnosis not present

## 2019-07-09 DIAGNOSIS — K869 Disease of pancreas, unspecified: Secondary | ICD-10-CM | POA: Diagnosis not present

## 2019-07-12 DIAGNOSIS — N319 Neuromuscular dysfunction of bladder, unspecified: Secondary | ICD-10-CM | POA: Diagnosis not present

## 2019-07-12 DIAGNOSIS — M5000 Cervical disc disorder with myelopathy, unspecified cervical region: Secondary | ICD-10-CM | POA: Diagnosis not present

## 2019-07-12 DIAGNOSIS — R1310 Dysphagia, unspecified: Secondary | ICD-10-CM | POA: Diagnosis not present

## 2019-07-12 DIAGNOSIS — Z431 Encounter for attention to gastrostomy: Secondary | ICD-10-CM | POA: Diagnosis not present

## 2019-07-13 DIAGNOSIS — R1310 Dysphagia, unspecified: Secondary | ICD-10-CM | POA: Diagnosis not present

## 2019-07-13 DIAGNOSIS — N319 Neuromuscular dysfunction of bladder, unspecified: Secondary | ICD-10-CM | POA: Diagnosis not present

## 2019-07-13 DIAGNOSIS — M5000 Cervical disc disorder with myelopathy, unspecified cervical region: Secondary | ICD-10-CM | POA: Diagnosis not present

## 2019-07-13 DIAGNOSIS — Z431 Encounter for attention to gastrostomy: Secondary | ICD-10-CM | POA: Diagnosis not present

## 2019-07-14 DIAGNOSIS — Z431 Encounter for attention to gastrostomy: Secondary | ICD-10-CM | POA: Diagnosis not present

## 2019-07-14 DIAGNOSIS — N319 Neuromuscular dysfunction of bladder, unspecified: Secondary | ICD-10-CM | POA: Diagnosis not present

## 2019-07-14 DIAGNOSIS — M5000 Cervical disc disorder with myelopathy, unspecified cervical region: Secondary | ICD-10-CM | POA: Diagnosis not present

## 2019-07-14 DIAGNOSIS — R1310 Dysphagia, unspecified: Secondary | ICD-10-CM | POA: Diagnosis not present

## 2019-07-15 DIAGNOSIS — R1310 Dysphagia, unspecified: Secondary | ICD-10-CM | POA: Diagnosis not present

## 2019-07-15 DIAGNOSIS — N319 Neuromuscular dysfunction of bladder, unspecified: Secondary | ICD-10-CM | POA: Diagnosis not present

## 2019-07-15 DIAGNOSIS — M5000 Cervical disc disorder with myelopathy, unspecified cervical region: Secondary | ICD-10-CM | POA: Diagnosis not present

## 2019-07-15 DIAGNOSIS — Z431 Encounter for attention to gastrostomy: Secondary | ICD-10-CM | POA: Diagnosis not present

## 2019-07-19 DIAGNOSIS — N319 Neuromuscular dysfunction of bladder, unspecified: Secondary | ICD-10-CM | POA: Diagnosis not present

## 2019-07-19 DIAGNOSIS — R1310 Dysphagia, unspecified: Secondary | ICD-10-CM | POA: Diagnosis not present

## 2019-07-19 DIAGNOSIS — Z431 Encounter for attention to gastrostomy: Secondary | ICD-10-CM | POA: Diagnosis not present

## 2019-07-19 DIAGNOSIS — M5000 Cervical disc disorder with myelopathy, unspecified cervical region: Secondary | ICD-10-CM | POA: Diagnosis not present

## 2019-07-21 DIAGNOSIS — Z431 Encounter for attention to gastrostomy: Secondary | ICD-10-CM | POA: Diagnosis not present

## 2019-07-21 DIAGNOSIS — N319 Neuromuscular dysfunction of bladder, unspecified: Secondary | ICD-10-CM | POA: Diagnosis not present

## 2019-07-21 DIAGNOSIS — M5000 Cervical disc disorder with myelopathy, unspecified cervical region: Secondary | ICD-10-CM | POA: Diagnosis not present

## 2019-07-21 DIAGNOSIS — R1310 Dysphagia, unspecified: Secondary | ICD-10-CM | POA: Diagnosis not present

## 2019-07-22 DIAGNOSIS — N319 Neuromuscular dysfunction of bladder, unspecified: Secondary | ICD-10-CM | POA: Diagnosis not present

## 2019-07-22 DIAGNOSIS — M5000 Cervical disc disorder with myelopathy, unspecified cervical region: Secondary | ICD-10-CM | POA: Diagnosis not present

## 2019-07-22 DIAGNOSIS — R1310 Dysphagia, unspecified: Secondary | ICD-10-CM | POA: Diagnosis not present

## 2019-07-22 DIAGNOSIS — Z431 Encounter for attention to gastrostomy: Secondary | ICD-10-CM | POA: Diagnosis not present

## 2019-07-23 DIAGNOSIS — R1310 Dysphagia, unspecified: Secondary | ICD-10-CM | POA: Diagnosis not present

## 2019-07-23 DIAGNOSIS — N319 Neuromuscular dysfunction of bladder, unspecified: Secondary | ICD-10-CM | POA: Diagnosis not present

## 2019-07-23 DIAGNOSIS — Z431 Encounter for attention to gastrostomy: Secondary | ICD-10-CM | POA: Diagnosis not present

## 2019-07-23 DIAGNOSIS — M5000 Cervical disc disorder with myelopathy, unspecified cervical region: Secondary | ICD-10-CM | POA: Diagnosis not present

## 2019-07-26 DIAGNOSIS — M5000 Cervical disc disorder with myelopathy, unspecified cervical region: Secondary | ICD-10-CM | POA: Diagnosis not present

## 2019-07-26 DIAGNOSIS — Z431 Encounter for attention to gastrostomy: Secondary | ICD-10-CM | POA: Diagnosis not present

## 2019-07-26 DIAGNOSIS — R1310 Dysphagia, unspecified: Secondary | ICD-10-CM | POA: Diagnosis not present

## 2019-07-26 DIAGNOSIS — N319 Neuromuscular dysfunction of bladder, unspecified: Secondary | ICD-10-CM | POA: Diagnosis not present

## 2019-07-27 DIAGNOSIS — R1310 Dysphagia, unspecified: Secondary | ICD-10-CM | POA: Diagnosis not present

## 2019-07-27 DIAGNOSIS — G992 Myelopathy in diseases classified elsewhere: Secondary | ICD-10-CM | POA: Diagnosis not present

## 2019-07-27 DIAGNOSIS — Z4789 Encounter for other orthopedic aftercare: Secondary | ICD-10-CM | POA: Diagnosis not present

## 2019-07-27 DIAGNOSIS — M542 Cervicalgia: Secondary | ICD-10-CM | POA: Diagnosis not present

## 2019-07-27 DIAGNOSIS — M4802 Spinal stenosis, cervical region: Secondary | ICD-10-CM | POA: Diagnosis not present

## 2019-07-27 DIAGNOSIS — Z981 Arthrodesis status: Secondary | ICD-10-CM | POA: Diagnosis not present

## 2019-07-27 DIAGNOSIS — G8929 Other chronic pain: Secondary | ICD-10-CM | POA: Diagnosis not present

## 2019-07-29 DIAGNOSIS — Z431 Encounter for attention to gastrostomy: Secondary | ICD-10-CM | POA: Diagnosis not present

## 2019-07-29 DIAGNOSIS — R1310 Dysphagia, unspecified: Secondary | ICD-10-CM | POA: Diagnosis not present

## 2019-07-29 DIAGNOSIS — M5000 Cervical disc disorder with myelopathy, unspecified cervical region: Secondary | ICD-10-CM | POA: Diagnosis not present

## 2019-07-29 DIAGNOSIS — N319 Neuromuscular dysfunction of bladder, unspecified: Secondary | ICD-10-CM | POA: Diagnosis not present

## 2019-07-30 DIAGNOSIS — N319 Neuromuscular dysfunction of bladder, unspecified: Secondary | ICD-10-CM | POA: Diagnosis not present

## 2019-07-30 DIAGNOSIS — M5000 Cervical disc disorder with myelopathy, unspecified cervical region: Secondary | ICD-10-CM | POA: Diagnosis not present

## 2019-07-30 DIAGNOSIS — Z431 Encounter for attention to gastrostomy: Secondary | ICD-10-CM | POA: Diagnosis not present

## 2019-07-30 DIAGNOSIS — R1310 Dysphagia, unspecified: Secondary | ICD-10-CM | POA: Diagnosis not present

## 2019-07-31 DIAGNOSIS — N319 Neuromuscular dysfunction of bladder, unspecified: Secondary | ICD-10-CM | POA: Diagnosis not present

## 2019-07-31 DIAGNOSIS — Z431 Encounter for attention to gastrostomy: Secondary | ICD-10-CM | POA: Diagnosis not present

## 2019-07-31 DIAGNOSIS — M5000 Cervical disc disorder with myelopathy, unspecified cervical region: Secondary | ICD-10-CM | POA: Diagnosis not present

## 2019-07-31 DIAGNOSIS — R1310 Dysphagia, unspecified: Secondary | ICD-10-CM | POA: Diagnosis not present

## 2019-08-04 DIAGNOSIS — N319 Neuromuscular dysfunction of bladder, unspecified: Secondary | ICD-10-CM | POA: Diagnosis not present

## 2019-08-04 DIAGNOSIS — M5000 Cervical disc disorder with myelopathy, unspecified cervical region: Secondary | ICD-10-CM | POA: Diagnosis not present

## 2019-08-04 DIAGNOSIS — Z431 Encounter for attention to gastrostomy: Secondary | ICD-10-CM | POA: Diagnosis not present

## 2019-08-04 DIAGNOSIS — R1310 Dysphagia, unspecified: Secondary | ICD-10-CM | POA: Diagnosis not present

## 2019-08-05 DIAGNOSIS — R1310 Dysphagia, unspecified: Secondary | ICD-10-CM | POA: Diagnosis not present

## 2019-08-05 DIAGNOSIS — Z431 Encounter for attention to gastrostomy: Secondary | ICD-10-CM | POA: Diagnosis not present

## 2019-08-05 DIAGNOSIS — N319 Neuromuscular dysfunction of bladder, unspecified: Secondary | ICD-10-CM | POA: Diagnosis not present

## 2019-08-05 DIAGNOSIS — M5000 Cervical disc disorder with myelopathy, unspecified cervical region: Secondary | ICD-10-CM | POA: Diagnosis not present

## 2019-08-06 DIAGNOSIS — N319 Neuromuscular dysfunction of bladder, unspecified: Secondary | ICD-10-CM | POA: Diagnosis not present

## 2019-08-06 DIAGNOSIS — R1310 Dysphagia, unspecified: Secondary | ICD-10-CM | POA: Diagnosis not present

## 2019-08-06 DIAGNOSIS — M5000 Cervical disc disorder with myelopathy, unspecified cervical region: Secondary | ICD-10-CM | POA: Diagnosis not present

## 2019-08-06 DIAGNOSIS — Z431 Encounter for attention to gastrostomy: Secondary | ICD-10-CM | POA: Diagnosis not present

## 2019-08-07 DIAGNOSIS — A419 Sepsis, unspecified organism: Secondary | ICD-10-CM | POA: Diagnosis not present

## 2019-08-07 DIAGNOSIS — R6521 Severe sepsis with septic shock: Secondary | ICD-10-CM | POA: Diagnosis not present

## 2019-08-07 DIAGNOSIS — I959 Hypotension, unspecified: Secondary | ICD-10-CM | POA: Diagnosis not present

## 2019-08-07 DIAGNOSIS — J690 Pneumonitis due to inhalation of food and vomit: Secondary | ICD-10-CM | POA: Diagnosis not present

## 2019-08-07 DIAGNOSIS — J9602 Acute respiratory failure with hypercapnia: Secondary | ICD-10-CM | POA: Diagnosis not present

## 2019-08-07 DIAGNOSIS — R4182 Altered mental status, unspecified: Secondary | ICD-10-CM | POA: Diagnosis not present

## 2019-08-07 DIAGNOSIS — Z20828 Contact with and (suspected) exposure to other viral communicable diseases: Secondary | ICD-10-CM | POA: Diagnosis not present

## 2019-08-07 DIAGNOSIS — R918 Other nonspecific abnormal finding of lung field: Secondary | ICD-10-CM | POA: Diagnosis not present

## 2019-08-07 DIAGNOSIS — N179 Acute kidney failure, unspecified: Secondary | ICD-10-CM | POA: Diagnosis not present

## 2019-08-08 DIAGNOSIS — J9602 Acute respiratory failure with hypercapnia: Secondary | ICD-10-CM | POA: Diagnosis not present

## 2019-08-08 DIAGNOSIS — G40909 Epilepsy, unspecified, not intractable, without status epilepticus: Secondary | ICD-10-CM | POA: Diagnosis not present

## 2019-08-08 DIAGNOSIS — G8250 Quadriplegia, unspecified: Secondary | ICD-10-CM | POA: Diagnosis not present

## 2019-08-08 DIAGNOSIS — Z981 Arthrodesis status: Secondary | ICD-10-CM | POA: Diagnosis not present

## 2019-08-08 DIAGNOSIS — J189 Pneumonia, unspecified organism: Secondary | ICD-10-CM | POA: Diagnosis not present

## 2019-08-08 DIAGNOSIS — Z431 Encounter for attention to gastrostomy: Secondary | ICD-10-CM | POA: Diagnosis not present

## 2019-08-08 DIAGNOSIS — A419 Sepsis, unspecified organism: Secondary | ICD-10-CM | POA: Diagnosis not present

## 2019-08-08 DIAGNOSIS — Z20828 Contact with and (suspected) exposure to other viral communicable diseases: Secondary | ICD-10-CM | POA: Diagnosis not present

## 2019-08-08 DIAGNOSIS — R188 Other ascites: Secondary | ICD-10-CM | POA: Diagnosis not present

## 2019-08-08 DIAGNOSIS — R633 Feeding difficulties: Secondary | ICD-10-CM | POA: Diagnosis not present

## 2019-08-08 DIAGNOSIS — R1310 Dysphagia, unspecified: Secondary | ICD-10-CM | POA: Diagnosis not present

## 2019-08-08 DIAGNOSIS — S069X9A Unspecified intracranial injury with loss of consciousness of unspecified duration, initial encounter: Secondary | ICD-10-CM | POA: Diagnosis not present

## 2019-08-08 DIAGNOSIS — Z043 Encounter for examination and observation following other accident: Secondary | ICD-10-CM | POA: Diagnosis not present

## 2019-08-08 DIAGNOSIS — G8929 Other chronic pain: Secondary | ICD-10-CM | POA: Diagnosis not present

## 2019-08-08 DIAGNOSIS — G959 Disease of spinal cord, unspecified: Secondary | ICD-10-CM | POA: Diagnosis not present

## 2019-08-08 DIAGNOSIS — Z452 Encounter for adjustment and management of vascular access device: Secondary | ICD-10-CM | POA: Diagnosis not present

## 2019-08-08 DIAGNOSIS — G992 Myelopathy in diseases classified elsewhere: Secondary | ICD-10-CM | POA: Diagnosis not present

## 2019-08-08 DIAGNOSIS — R7401 Elevation of levels of liver transaminase levels: Secondary | ICD-10-CM | POA: Diagnosis not present

## 2019-08-08 DIAGNOSIS — Y92239 Unspecified place in hospital as the place of occurrence of the external cause: Secondary | ICD-10-CM | POA: Diagnosis not present

## 2019-08-08 DIAGNOSIS — R4182 Altered mental status, unspecified: Secondary | ICD-10-CM | POA: Diagnosis not present

## 2019-08-08 DIAGNOSIS — N179 Acute kidney failure, unspecified: Secondary | ICD-10-CM | POA: Diagnosis not present

## 2019-08-08 DIAGNOSIS — R6521 Severe sepsis with septic shock: Secondary | ICD-10-CM | POA: Diagnosis not present

## 2019-08-08 DIAGNOSIS — J690 Pneumonitis due to inhalation of food and vomit: Secondary | ICD-10-CM | POA: Diagnosis not present

## 2019-08-08 DIAGNOSIS — R262 Difficulty in walking, not elsewhere classified: Secondary | ICD-10-CM | POA: Diagnosis not present

## 2019-08-08 DIAGNOSIS — M5000 Cervical disc disorder with myelopathy, unspecified cervical region: Secondary | ICD-10-CM | POA: Diagnosis not present

## 2019-08-08 DIAGNOSIS — J9601 Acute respiratory failure with hypoxia: Secondary | ICD-10-CM | POA: Diagnosis not present

## 2019-08-08 DIAGNOSIS — Z79899 Other long term (current) drug therapy: Secondary | ICD-10-CM | POA: Diagnosis not present

## 2019-08-08 DIAGNOSIS — R109 Unspecified abdominal pain: Secondary | ICD-10-CM | POA: Diagnosis not present

## 2019-08-08 DIAGNOSIS — N319 Neuromuscular dysfunction of bladder, unspecified: Secondary | ICD-10-CM | POA: Diagnosis not present

## 2019-08-08 DIAGNOSIS — M4802 Spinal stenosis, cervical region: Secondary | ICD-10-CM | POA: Diagnosis not present

## 2019-08-08 DIAGNOSIS — R0602 Shortness of breath: Secondary | ICD-10-CM | POA: Diagnosis not present

## 2019-08-08 DIAGNOSIS — M542 Cervicalgia: Secondary | ICD-10-CM | POA: Diagnosis not present

## 2019-08-08 DIAGNOSIS — R69 Illness, unspecified: Secondary | ICD-10-CM | POA: Diagnosis not present

## 2019-08-08 DIAGNOSIS — R918 Other nonspecific abnormal finding of lung field: Secondary | ICD-10-CM | POA: Diagnosis not present

## 2019-08-08 DIAGNOSIS — J984 Other disorders of lung: Secondary | ICD-10-CM | POA: Diagnosis not present

## 2019-08-08 DIAGNOSIS — R531 Weakness: Secondary | ICD-10-CM | POA: Diagnosis not present

## 2019-08-08 DIAGNOSIS — Z931 Gastrostomy status: Secondary | ICD-10-CM | POA: Diagnosis not present

## 2019-08-08 DIAGNOSIS — R2689 Other abnormalities of gait and mobility: Secondary | ICD-10-CM | POA: Diagnosis not present

## 2019-08-12 DIAGNOSIS — R1310 Dysphagia, unspecified: Secondary | ICD-10-CM | POA: Diagnosis not present

## 2019-08-12 DIAGNOSIS — Z431 Encounter for attention to gastrostomy: Secondary | ICD-10-CM | POA: Diagnosis not present

## 2019-08-12 DIAGNOSIS — M5000 Cervical disc disorder with myelopathy, unspecified cervical region: Secondary | ICD-10-CM | POA: Diagnosis not present

## 2019-08-12 DIAGNOSIS — N319 Neuromuscular dysfunction of bladder, unspecified: Secondary | ICD-10-CM | POA: Diagnosis not present

## 2019-08-20 DIAGNOSIS — M5000 Cervical disc disorder with myelopathy, unspecified cervical region: Secondary | ICD-10-CM | POA: Diagnosis not present

## 2019-08-20 DIAGNOSIS — N319 Neuromuscular dysfunction of bladder, unspecified: Secondary | ICD-10-CM | POA: Diagnosis not present

## 2019-08-20 DIAGNOSIS — Z431 Encounter for attention to gastrostomy: Secondary | ICD-10-CM | POA: Diagnosis not present

## 2019-08-20 DIAGNOSIS — R1310 Dysphagia, unspecified: Secondary | ICD-10-CM | POA: Diagnosis not present

## 2019-08-23 DIAGNOSIS — Z431 Encounter for attention to gastrostomy: Secondary | ICD-10-CM | POA: Diagnosis not present

## 2019-08-23 DIAGNOSIS — R1310 Dysphagia, unspecified: Secondary | ICD-10-CM | POA: Diagnosis not present

## 2019-08-23 DIAGNOSIS — N319 Neuromuscular dysfunction of bladder, unspecified: Secondary | ICD-10-CM | POA: Diagnosis not present

## 2019-08-23 DIAGNOSIS — M5000 Cervical disc disorder with myelopathy, unspecified cervical region: Secondary | ICD-10-CM | POA: Diagnosis not present

## 2019-08-24 DIAGNOSIS — R1310 Dysphagia, unspecified: Secondary | ICD-10-CM | POA: Diagnosis not present

## 2019-08-24 DIAGNOSIS — M5000 Cervical disc disorder with myelopathy, unspecified cervical region: Secondary | ICD-10-CM | POA: Diagnosis not present

## 2019-08-24 DIAGNOSIS — N319 Neuromuscular dysfunction of bladder, unspecified: Secondary | ICD-10-CM | POA: Diagnosis not present

## 2019-08-24 DIAGNOSIS — Z431 Encounter for attention to gastrostomy: Secondary | ICD-10-CM | POA: Diagnosis not present

## 2019-08-31 DIAGNOSIS — N319 Neuromuscular dysfunction of bladder, unspecified: Secondary | ICD-10-CM | POA: Diagnosis not present

## 2019-08-31 DIAGNOSIS — M5000 Cervical disc disorder with myelopathy, unspecified cervical region: Secondary | ICD-10-CM | POA: Diagnosis not present

## 2019-08-31 DIAGNOSIS — R1310 Dysphagia, unspecified: Secondary | ICD-10-CM | POA: Diagnosis not present

## 2019-08-31 DIAGNOSIS — Z431 Encounter for attention to gastrostomy: Secondary | ICD-10-CM | POA: Diagnosis not present

## 2019-09-01 DIAGNOSIS — G8929 Other chronic pain: Secondary | ICD-10-CM | POA: Diagnosis not present

## 2019-09-01 DIAGNOSIS — Z931 Gastrostomy status: Secondary | ICD-10-CM | POA: Diagnosis not present

## 2019-09-01 DIAGNOSIS — L89301 Pressure ulcer of unspecified buttock, stage 1: Secondary | ICD-10-CM | POA: Diagnosis not present

## 2019-09-01 DIAGNOSIS — G992 Myelopathy in diseases classified elsewhere: Secondary | ICD-10-CM | POA: Diagnosis not present

## 2019-09-01 DIAGNOSIS — R1310 Dysphagia, unspecified: Secondary | ICD-10-CM | POA: Diagnosis not present

## 2019-09-01 DIAGNOSIS — M4802 Spinal stenosis, cervical region: Secondary | ICD-10-CM | POA: Diagnosis not present

## 2019-09-01 DIAGNOSIS — R69 Illness, unspecified: Secondary | ICD-10-CM | POA: Diagnosis not present

## 2019-09-01 DIAGNOSIS — R531 Weakness: Secondary | ICD-10-CM | POA: Diagnosis not present

## 2019-09-02 DIAGNOSIS — Z431 Encounter for attention to gastrostomy: Secondary | ICD-10-CM | POA: Diagnosis not present

## 2019-09-02 DIAGNOSIS — N319 Neuromuscular dysfunction of bladder, unspecified: Secondary | ICD-10-CM | POA: Diagnosis not present

## 2019-09-02 DIAGNOSIS — M5000 Cervical disc disorder with myelopathy, unspecified cervical region: Secondary | ICD-10-CM | POA: Diagnosis not present

## 2019-09-02 DIAGNOSIS — R1310 Dysphagia, unspecified: Secondary | ICD-10-CM | POA: Diagnosis not present

## 2019-09-03 DIAGNOSIS — M5000 Cervical disc disorder with myelopathy, unspecified cervical region: Secondary | ICD-10-CM | POA: Diagnosis not present

## 2019-09-03 DIAGNOSIS — N319 Neuromuscular dysfunction of bladder, unspecified: Secondary | ICD-10-CM | POA: Diagnosis not present

## 2019-09-03 DIAGNOSIS — Z431 Encounter for attention to gastrostomy: Secondary | ICD-10-CM | POA: Diagnosis not present

## 2019-09-03 DIAGNOSIS — R1310 Dysphagia, unspecified: Secondary | ICD-10-CM | POA: Diagnosis not present

## 2019-09-07 DIAGNOSIS — Z431 Encounter for attention to gastrostomy: Secondary | ICD-10-CM | POA: Diagnosis not present

## 2019-09-07 DIAGNOSIS — N319 Neuromuscular dysfunction of bladder, unspecified: Secondary | ICD-10-CM | POA: Diagnosis not present

## 2019-09-07 DIAGNOSIS — M5000 Cervical disc disorder with myelopathy, unspecified cervical region: Secondary | ICD-10-CM | POA: Diagnosis not present

## 2019-09-07 DIAGNOSIS — R1310 Dysphagia, unspecified: Secondary | ICD-10-CM | POA: Diagnosis not present

## 2019-09-08 DIAGNOSIS — Z431 Encounter for attention to gastrostomy: Secondary | ICD-10-CM | POA: Diagnosis not present

## 2019-09-08 DIAGNOSIS — M5000 Cervical disc disorder with myelopathy, unspecified cervical region: Secondary | ICD-10-CM | POA: Diagnosis not present

## 2019-09-08 DIAGNOSIS — N319 Neuromuscular dysfunction of bladder, unspecified: Secondary | ICD-10-CM | POA: Diagnosis not present

## 2019-09-08 DIAGNOSIS — R1310 Dysphagia, unspecified: Secondary | ICD-10-CM | POA: Diagnosis not present

## 2019-09-09 DIAGNOSIS — R1310 Dysphagia, unspecified: Secondary | ICD-10-CM | POA: Diagnosis not present

## 2019-09-09 DIAGNOSIS — N319 Neuromuscular dysfunction of bladder, unspecified: Secondary | ICD-10-CM | POA: Diagnosis not present

## 2019-09-09 DIAGNOSIS — Z431 Encounter for attention to gastrostomy: Secondary | ICD-10-CM | POA: Diagnosis not present

## 2019-09-09 DIAGNOSIS — M5000 Cervical disc disorder with myelopathy, unspecified cervical region: Secondary | ICD-10-CM | POA: Diagnosis not present

## 2019-09-13 DIAGNOSIS — R1310 Dysphagia, unspecified: Secondary | ICD-10-CM | POA: Diagnosis not present

## 2019-09-13 DIAGNOSIS — M5000 Cervical disc disorder with myelopathy, unspecified cervical region: Secondary | ICD-10-CM | POA: Diagnosis not present

## 2019-09-13 DIAGNOSIS — Z431 Encounter for attention to gastrostomy: Secondary | ICD-10-CM | POA: Diagnosis not present

## 2019-09-13 DIAGNOSIS — N319 Neuromuscular dysfunction of bladder, unspecified: Secondary | ICD-10-CM | POA: Diagnosis not present

## 2019-09-15 DIAGNOSIS — Z431 Encounter for attention to gastrostomy: Secondary | ICD-10-CM | POA: Diagnosis not present

## 2019-09-15 DIAGNOSIS — R1310 Dysphagia, unspecified: Secondary | ICD-10-CM | POA: Diagnosis not present

## 2019-09-15 DIAGNOSIS — N319 Neuromuscular dysfunction of bladder, unspecified: Secondary | ICD-10-CM | POA: Diagnosis not present

## 2019-09-15 DIAGNOSIS — M5000 Cervical disc disorder with myelopathy, unspecified cervical region: Secondary | ICD-10-CM | POA: Diagnosis not present

## 2019-09-16 DIAGNOSIS — R1310 Dysphagia, unspecified: Secondary | ICD-10-CM | POA: Diagnosis not present

## 2019-09-16 DIAGNOSIS — M5000 Cervical disc disorder with myelopathy, unspecified cervical region: Secondary | ICD-10-CM | POA: Diagnosis not present

## 2019-09-16 DIAGNOSIS — Z431 Encounter for attention to gastrostomy: Secondary | ICD-10-CM | POA: Diagnosis not present

## 2019-09-16 DIAGNOSIS — N319 Neuromuscular dysfunction of bladder, unspecified: Secondary | ICD-10-CM | POA: Diagnosis not present

## 2019-09-17 DIAGNOSIS — R1310 Dysphagia, unspecified: Secondary | ICD-10-CM | POA: Diagnosis not present

## 2019-09-17 DIAGNOSIS — N319 Neuromuscular dysfunction of bladder, unspecified: Secondary | ICD-10-CM | POA: Diagnosis not present

## 2019-09-17 DIAGNOSIS — Z431 Encounter for attention to gastrostomy: Secondary | ICD-10-CM | POA: Diagnosis not present

## 2019-09-17 DIAGNOSIS — M5000 Cervical disc disorder with myelopathy, unspecified cervical region: Secondary | ICD-10-CM | POA: Diagnosis not present

## 2019-09-18 DIAGNOSIS — G40909 Epilepsy, unspecified, not intractable, without status epilepticus: Secondary | ICD-10-CM | POA: Diagnosis not present

## 2019-09-18 DIAGNOSIS — R2689 Other abnormalities of gait and mobility: Secondary | ICD-10-CM | POA: Diagnosis not present

## 2019-09-18 DIAGNOSIS — G8929 Other chronic pain: Secondary | ICD-10-CM | POA: Diagnosis not present

## 2019-09-18 DIAGNOSIS — M4802 Spinal stenosis, cervical region: Secondary | ICD-10-CM | POA: Diagnosis not present

## 2019-09-20 DIAGNOSIS — M5000 Cervical disc disorder with myelopathy, unspecified cervical region: Secondary | ICD-10-CM | POA: Diagnosis not present

## 2019-09-20 DIAGNOSIS — R1310 Dysphagia, unspecified: Secondary | ICD-10-CM | POA: Diagnosis not present

## 2019-09-20 DIAGNOSIS — N319 Neuromuscular dysfunction of bladder, unspecified: Secondary | ICD-10-CM | POA: Diagnosis not present

## 2019-09-20 DIAGNOSIS — Z431 Encounter for attention to gastrostomy: Secondary | ICD-10-CM | POA: Diagnosis not present

## 2019-09-28 DIAGNOSIS — Z431 Encounter for attention to gastrostomy: Secondary | ICD-10-CM | POA: Diagnosis not present

## 2019-09-28 DIAGNOSIS — M5000 Cervical disc disorder with myelopathy, unspecified cervical region: Secondary | ICD-10-CM | POA: Diagnosis not present

## 2019-09-28 DIAGNOSIS — N319 Neuromuscular dysfunction of bladder, unspecified: Secondary | ICD-10-CM | POA: Diagnosis not present

## 2019-09-28 DIAGNOSIS — R1310 Dysphagia, unspecified: Secondary | ICD-10-CM | POA: Diagnosis not present

## 2019-09-29 DIAGNOSIS — R1310 Dysphagia, unspecified: Secondary | ICD-10-CM | POA: Diagnosis not present

## 2019-09-29 DIAGNOSIS — N319 Neuromuscular dysfunction of bladder, unspecified: Secondary | ICD-10-CM | POA: Diagnosis not present

## 2019-09-29 DIAGNOSIS — M5000 Cervical disc disorder with myelopathy, unspecified cervical region: Secondary | ICD-10-CM | POA: Diagnosis not present

## 2019-09-29 DIAGNOSIS — Z431 Encounter for attention to gastrostomy: Secondary | ICD-10-CM | POA: Diagnosis not present

## 2019-09-30 DIAGNOSIS — M5000 Cervical disc disorder with myelopathy, unspecified cervical region: Secondary | ICD-10-CM | POA: Diagnosis not present

## 2019-09-30 DIAGNOSIS — N319 Neuromuscular dysfunction of bladder, unspecified: Secondary | ICD-10-CM | POA: Diagnosis not present

## 2019-09-30 DIAGNOSIS — R1310 Dysphagia, unspecified: Secondary | ICD-10-CM | POA: Diagnosis not present

## 2019-09-30 DIAGNOSIS — Z431 Encounter for attention to gastrostomy: Secondary | ICD-10-CM | POA: Diagnosis not present
